# Patient Record
Sex: Female | Born: 1995 | Race: White | Hispanic: No | Marital: Single | Smoking: Current every day smoker
Health system: Southern US, Community
[De-identification: ages and names within clinical notes are randomized; demographics above are authoritative.]

## PROBLEM LIST (undated history)

## (undated) ENCOUNTER — Inpatient Hospital Stay (HOSPITAL_COMMUNITY): Payer: Self-pay

## (undated) DIAGNOSIS — N760 Acute vaginitis: Secondary | ICD-10-CM

## (undated) DIAGNOSIS — B029 Zoster without complications: Secondary | ICD-10-CM

## (undated) DIAGNOSIS — I10 Essential (primary) hypertension: Secondary | ICD-10-CM

## (undated) DIAGNOSIS — D649 Anemia, unspecified: Secondary | ICD-10-CM

## (undated) HISTORY — PX: WISDOM TOOTH EXTRACTION: SHX21

---
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
## 2013-05-07 ENCOUNTER — Inpatient Hospital Stay (HOSPITAL_COMMUNITY): Admission: AD | Admit: 2013-05-07 | Payer: Self-pay | Source: Ambulatory Visit | Admitting: Obstetrics and Gynecology

## 2013-09-15 LAB — OB RESULTS CONSOLE GC/CHLAMYDIA
Chlamydia: NEGATIVE
Gonorrhea: NEGATIVE

## 2013-09-27 ENCOUNTER — Other Ambulatory Visit: Payer: Self-pay

## 2013-09-27 LAB — OB RESULTS CONSOLE ABO/RH: RH Type: POSITIVE

## 2013-09-27 LAB — OB RESULTS CONSOLE RPR: RPR: NONREACTIVE

## 2013-09-27 LAB — OB RESULTS CONSOLE RUBELLA ANTIBODY, IGM: RUBELLA: IMMUNE

## 2013-09-27 LAB — OB RESULTS CONSOLE ANTIBODY SCREEN: ANTIBODY SCREEN: NEGATIVE

## 2013-09-27 LAB — OB RESULTS CONSOLE HIV ANTIBODY (ROUTINE TESTING): HIV: NONREACTIVE

## 2013-09-27 LAB — OB RESULTS CONSOLE HEPATITIS B SURFACE ANTIGEN: HEP B S AG: NEGATIVE

## 2013-10-07 NOTE — L&D Delivery Note (Signed)
Patient was C/C/+3 and pushed for 5 minutes with epidural.   NSVD  female infant, Apgars 9,9, weight P.   The patient had two small first degree bilateral labial lacerations repaired with 3-0 vicryl r. Fundus was firm. EBL was expected. Placenta was delivered intact. Vagina was clear.  Baby was vigorous and doing skin to skin with mother.  Anita Herrera A

## 2013-10-29 ENCOUNTER — Encounter (HOSPITAL_COMMUNITY): Payer: Self-pay

## 2013-10-29 ENCOUNTER — Ambulatory Visit (HOSPITAL_COMMUNITY)
Admission: RE | Admit: 2013-10-29 | Discharge: 2013-10-29 | Disposition: A | Discharge status: Home / Self Care | Payer: Medicaid Other | Source: Ambulatory Visit | Attending: Obstetrics and Gynecology | Admitting: Obstetrics and Gynecology

## 2013-10-29 NOTE — ED Notes (Signed)
°  Genetic Counseling  DOB: [birth year] Referring Provider: Levi AlandAnderson, Mark E, MD Appointment Date: 10/29/2013 Attending: Dr. Particia NearingMartha Decker  Anita Herrera was seen for genetic counseling because of an increased risk for fetal Down syndrome based on a First trimester screen.  She was counseled regarding the First trimester screen result and the associated 1 in 170 risk for fetal Down syndrome.  We reviewed chromosomes, nondisjunction, and the common features and variable prognosis of Down syndrome.  In addition, we reviewed the screen adjusted reduction in risks for trisomy 10618.  We also discussed other explanations for a screen positive result including: a gestational dating error, differences in maternal metabolism, and normal variation. She understands that this screening is not diagnostic for Down syndrome but provides a risk assessment.  We reviewed available screening options including noninvasive prenatal screening (NIPS)/cell free fetal DNA (cffDNA) testing, and detailed ultrasound.  She was counseled that screening tests are used to modify a patient's a priori risk for aneuploidy, typically based on age. This estimate provides a pregnancy specific risk assessment. We reviewed the benefits and limitations of each option. Specifically, we discussed the conditions for which each test screens, the detection rates, and false positive rates of each. She was also counseled regarding diagnostic testing via amniocentesis. We reviewed the approximate 1 in 300-500 risk for complications for amniocentesis, including spontaneous pregnancy loss.   After consideration of all the options, she declined further testing at this time.  She would like to wait for her anatomy ultrasound at her primary OB's office and see if features of Down syndrome are identified at that time. She understands that screening tests (including ultrasound) cannot rule out all birth defects or genetic syndromes. The patient was  advised of this limitation and states she still does not want additional testing or screening at this time.   Anita Herrera was provided with written information regarding sickle cell anemia (SCA) including the carrier frequency and incidence in the African-American population, the availability of carrier testing and prenatal diagnosis if indicated.  In addition, we discussed that hemoglobinopathies are routinely screened for as part of the [facility] newborn screening panel.  Hemoglobin electrophoresis has already been performed and was reported to be within normal limits.   Both family histories were reviewed and found to be noncontributory. Anita Herrera  was not familiar with the father of the baby's family history.  We, therefore, cannot comment on how his history might contribute to the overall chance for the baby to have a birth defect.  Without further information regarding the provided family history, an accurate genetic risk cannot be calculated. Further genetic counseling is warranted if more information is obtained.  Anita Herrera denied exposure to environmental toxins or chemical agents. She denied the use of alcohol, tobacco or street drugs. She denied significant viral illnesses during the course of her pregnancy. Her medical and surgical histories were noncontributory.   I counseled Anita Herrera for approximately 40 minutes regarding the above risks and available options.   Mady Gemmaaragh Conrad, MS  Certified Genetic Counselor

## 2013-12-09 ENCOUNTER — Other Ambulatory Visit: Payer: Self-pay

## 2013-12-09 ENCOUNTER — Other Ambulatory Visit (HOSPITAL_COMMUNITY): Payer: Self-pay | Admitting: Obstetrics and Gynecology

## 2013-12-09 DIAGNOSIS — O35BXX0 Maternal care for other (suspected) fetal abnormality and damage, fetal cardiac anomalies, not applicable or unspecified: Secondary | ICD-10-CM

## 2013-12-09 DIAGNOSIS — Z3689 Encounter for other specified antenatal screening: Secondary | ICD-10-CM

## 2013-12-09 DIAGNOSIS — O358XX0 Maternal care for other (suspected) fetal abnormality and damage, not applicable or unspecified: Secondary | ICD-10-CM

## 2013-12-13 ENCOUNTER — Ambulatory Visit (HOSPITAL_COMMUNITY)
Admission: RE | Admit: 2013-12-13 | Discharge: 2013-12-13 | Disposition: A | Discharge status: Home / Self Care | Payer: Medicaid Other | Source: Ambulatory Visit | Attending: Obstetrics and Gynecology | Admitting: Obstetrics and Gynecology

## 2013-12-13 ENCOUNTER — Encounter (HOSPITAL_COMMUNITY): Payer: Self-pay

## 2013-12-13 DIAGNOSIS — O35BXX0 Maternal care for other (suspected) fetal abnormality and damage, fetal cardiac anomalies, not applicable or unspecified: Secondary | ICD-10-CM

## 2013-12-13 DIAGNOSIS — Z363 Encounter for antenatal screening for malformations: Secondary | ICD-10-CM | POA: Insufficient documentation

## 2013-12-13 DIAGNOSIS — Z3689 Encounter for other specified antenatal screening: Secondary | ICD-10-CM

## 2013-12-13 DIAGNOSIS — Z1389 Encounter for screening for other disorder: Secondary | ICD-10-CM | POA: Insufficient documentation

## 2013-12-13 DIAGNOSIS — O289 Unspecified abnormal findings on antenatal screening of mother: Secondary | ICD-10-CM | POA: Insufficient documentation

## 2013-12-13 DIAGNOSIS — O358XX0 Maternal care for other (suspected) fetal abnormality and damage, not applicable or unspecified: Secondary | ICD-10-CM | POA: Insufficient documentation

## 2013-12-13 NOTE — Progress Notes (Signed)
[facility] ultrasound  Indication: 18 yr old G1P0 at 54w6dwith low PAPP-A of 0.33MoM and increased risk of trisomy 228of 1:170 for fetal anatomic survey. Finding of echogenic focus on outside ultrasound.  Findings: 1. Single intrauterine pregnancy. 2. Fetaal biometry is consistent with dating. 3. Posterior placenta without evidence of previa. 4. Normal amniotic fluid volume. 5. Normal transabdominal cervical length. 6. There is an echogenic focus in the left ventricle. 7. The remainder of the fetal anatomic survey is normal.  Recommendations: 1. Appropriate fetal growth. 2. Normal fetal anatomic survey. 3. Echogenic focus in the left ventricle: discussed the slight association with fetal aneuploidy; specifically trisomy 21. Discussed when found in isolation it increases the risk of trisomy 21 by approximately 10%. Patient had first trimester screen which showed risk of trisomy 21 of 1:170. Discussed ultrasound finding today increases this risk by about 10%. Patient had previously met with genetic counselor to discuss results and options including amniocentesis and cell free fetal DNA. Again offered cell free fetal DNA and amniocentesis and risks/benefits/limitations of each.  Discussed options of cell free fetal DNA and its limitations in detecting fetal aneuploidy. Discussed benefits/risks of amniocentesis including risk of bleeding, infection, rupture of membranes, or pregnancy loss of 1/400. After counseling patient declined all of the above 4. Low PAPP-A: I discussed the association with fetal aneuploidy. I also discussed the association with increased risk of adverse pregnancy outcomes including increased risk of fetal growth restriction, preeclampsia, stillbirth, preterm birth, and abruption. I discussed these risks are only slightly increased over baseline but heightened surveillance is warranted. I recommend serial fetal growth ultrasounds every 4-6 weeks. I recommend  antenatal testing only in the setting of fetal growth restriction. I recommend close surveillance for the development of signs/symptoms of preterm labor, PPROM, and preeclampsia.   [place] MD

## 2014-01-27 LAB — OB RESULTS CONSOLE RPR: RPR: NONREACTIVE

## 2014-02-21 LAB — OB RESULTS CONSOLE GC/CHLAMYDIA
Chlamydia: NEGATIVE
Gonorrhea: NEGATIVE

## 2014-03-21 LAB — OB RESULTS CONSOLE GBS: GBS: POSITIVE

## 2014-04-08 ENCOUNTER — Inpatient Hospital Stay (HOSPITAL_COMMUNITY)
Admission: AD | Admit: 2014-04-08 | Discharge: 2014-04-08 | Disposition: A | Discharge status: Home / Self Care | Payer: Medicaid Other | Source: Ambulatory Visit | Attending: Obstetrics and Gynecology | Admitting: Obstetrics and Gynecology

## 2014-04-08 DIAGNOSIS — O9989 Other specified diseases and conditions complicating pregnancy, childbirth and the puerperium: Principal | ICD-10-CM

## 2014-04-08 DIAGNOSIS — O212 Late vomiting of pregnancy: Secondary | ICD-10-CM | POA: Insufficient documentation

## 2014-04-08 DIAGNOSIS — K219 Gastro-esophageal reflux disease without esophagitis: Secondary | ICD-10-CM

## 2014-04-08 DIAGNOSIS — O99330 Smoking (tobacco) complicating pregnancy, unspecified trimester: Secondary | ICD-10-CM | POA: Insufficient documentation

## 2014-04-08 DIAGNOSIS — O99891 Other specified diseases and conditions complicating pregnancy: Secondary | ICD-10-CM | POA: Insufficient documentation

## 2014-04-08 LAB — URINALYSIS, ROUTINE W REFLEX MICROSCOPIC
Bilirubin Urine: NEGATIVE
GLUCOSE, UA: NEGATIVE mg/dL
Ketones, ur: NEGATIVE mg/dL
LEUKOCYTES UA: NEGATIVE
Nitrite: NEGATIVE
PH: 7 (ref 5.0–8.0)
Protein, ur: NEGATIVE mg/dL
SPECIFIC GRAVITY, URINE: 1.01 (ref 1.005–1.030)
Urobilinogen, UA: 0.2 mg/dL (ref 0.0–1.0)

## 2014-04-08 LAB — URINE MICROSCOPIC-ADD ON

## 2014-04-08 MED ORDER — PROMETHAZINE HCL 12.5 MG PO TABS: 12.5000 mg | ORAL_TABLET | Freq: Four times a day (QID) | ORAL | Status: DC | PRN

## 2014-04-08 NOTE — MAU Note (Signed)
Patient states she has been vomiting off and on for weeks. Sometimes the second time she vomits its dark red like blood. Denies contractions, vaginal bleeding, LOF.

## 2014-04-08 NOTE — Discharge Instructions (Signed)
Get Prilosec or Pepcid over-the-counter and take twice a day at least until next visit in the office  Heartburn During Pregnancy  Heartburn happens when stomach acid goes up into the esophagus. The esophagus is the tube between the mouth and the stomach. This acid causes a burning pain in the chest or throat. This happens more often in the later part of pregnancy because the womb (uterus) gets larger. It may also happen because of hormone changes. Heartburn problems often go away after giving birth. HOME CARE  Take all medicine as told by your doctor.  Raise the head of your bed with blocks only as told by your doctor.  Do not exercise right after eating.  Avoid eating 2-3 hours before bed. Do not lie down right after eating.  Eat small meals throughout the day instead of 3 large meals.  Avoid foods that give you heartburn. Foods you may want to avoid include:  Peppers.  Chocolate.  High-fat foods, including fried foods.  Spicy foods.  Garlic and onions.  Citrus fruits, including oranges, grapefruit, lemons, and limes.  Food containing tomatoes or tomato products.  Mint.  Bubbly (carbonated) drinks and drinks with caffeine.  Vinegar. GET HELP IF:  You have any belly (abdominal) pain.  You feel burning in your upper belly or chest, especially after eating or lying down.  You feel sick to your stomach (nauseous) and throw up (vomit).  Your stomach feels upset after you eat. GET HELP RIGHT AWAY IF:  You have bad chest pain that goes down your arm or into your jaw or neck.  You feel sweaty, dizzy, or light-headed.  You have trouble breathing.  You throw up blood.  You have trouble or pain when swallowing.  You have bloody or black poop (stool).  You have heartburn more than 3 times a week, for more than 2 weeks. MAKE SURE YOU:  Understand these instructions.  Will watch your condition.  Will get help right away if you are not doing well or get  worse. Document Released: 10/26/2010 Document Revised: 09/28/2013 Document Reviewed: 05/12/2013 [facility] Patient Information 2015 [place], [place]. This information is not intended to replace advice given to you by your health care provider. Make sure you discuss any questions you have with your health care provider.

## 2014-04-08 NOTE — MAU Provider Note (Signed)
History     CSN: [id number]  Arrival date and time: 04/08/14 0050   First Provider Initiated Contact with Patient 04/08/14 0116      Chief Complaint  Patient presents with  . Emesis During Pregnancy   HPI Anita Herrera is a 18 y.o. G1P0000 at 6237w3d who presents to MAU today with complaint of bloody emesis. The patient states that she has had vomiting after eating over the last few days. She states 2 episodes today and one yesterday. She denies sick contacts. She has noted occasional loose stools without diarrhea. She notes that she vomits up the food that she just ate and then will vomit mucus with blood in it. She denies heartburn or abdominal pain. She reports good fetal movement and denies contractions, vaginal bleeding or LOF.   OB History   Grav Para Term Preterm Abortions TAB SAB Ect Mult Living   1 0 0 0 0 0 0 0 0 0       No past medical history on file.  No past surgical history on file.  No family history on file.  History  Substance Use Topics  . Smoking status: Current Every Day Smoker  . Smokeless tobacco: Not on file  . Alcohol Use: Not on file    Allergies: No Known Allergies  Prescriptions prior to admission  Medication Sig Dispense Refill  . azithromycin (ZITHROMAX Z-PAK) 250 MG tablet Take 2 tablets daily x 1 day then one tablet daily x 4 days  6 each  0    Review of Systems  Constitutional: Negative for fever and malaise/fatigue.  Gastrointestinal: Positive for nausea and vomiting. Negative for abdominal pain, diarrhea and constipation.  Genitourinary:       Neg - vaginal bleeding, discharge, LOF   Physical Exam   Blood pressure 132/67, pulse 88, temperature 97.8 F (36.6 C), temperature source Oral, resp. rate 18.  Physical Exam  Constitutional: She is oriented to person, place, and time. She appears well-developed and well-nourished. No distress.  HENT:  Head: Normocephalic and atraumatic.  Cardiovascular: Normal rate, regular rhythm  and normal heart sounds.   Respiratory: Effort normal and breath sounds normal. No respiratory distress.  GI: Soft. Bowel sounds are normal. She exhibits no distension and no mass. There is no tenderness. There is no rebound and no guarding.  Neurological: She is alert and oriented to person, place, and time.  Skin: Skin is warm and dry. No erythema.  Psychiatric: She has a normal mood and affect.   Results for orders placed during the hospital encounter of 04/08/14 (from the past 24 hour(s))  URINALYSIS, ROUTINE W REFLEX MICROSCOPIC     Status: Abnormal   Collection Time    04/08/14 12:52 AM      Result Value Ref Range   Color, Urine YELLOW  YELLOW   APPearance CLEAR  CLEAR   Specific Gravity, Urine 1.010  1.005 - 1.030   pH 7.0  5.0 - 8.0   Glucose, UA NEGATIVE  NEGATIVE mg/dL   Hgb urine dipstick MODERATE (*) NEGATIVE   Bilirubin Urine NEGATIVE  NEGATIVE   Ketones, ur NEGATIVE  NEGATIVE mg/dL   Protein, ur NEGATIVE  NEGATIVE mg/dL   Urobilinogen, UA 0.2  0.0 - 1.0 mg/dL   Nitrite NEGATIVE  NEGATIVE   Leukocytes, UA NEGATIVE  NEGATIVE  URINE MICROSCOPIC-ADD ON     Status: Abnormal   Collection Time    04/08/14 12:52 AM      Result Value Ref Range  Squamous Epithelial / LPF FEW (*) RARE   WBC, UA 0-2  <3 WBC/hpf   RBC / HPF 11-20  <3 RBC/hpf   Bacteria, UA RARE  RARE    Fetal monitoring: Baseline: 120 bpm, moderate variability, + accelerations, no decelerations Contractions: Moderate UI  MAU Course  Procedures None  MDM Discussed with Dr. Henderson CloudHorvath. Rx for Phenergan and recommend Prilosec or Pepcid OTC BID. Follow-up as scheduled  Assessment and Plan  A: SIUP at 2775w3d Acid reflux  P: Discharge home Rx for Phenergan given to patient Patient advised to take Prilosec or Pepcid OTC BID  Patient advised to limit greasy, fried, acidic or spicy foods Patient advised to follow-up with Dr. Henderson CloudHorvath as scheduled for routine prenatal care Patient may return to MAU as  needed or if her condition were to change or worsen   Freddi StarrJulie N Ethier, PA-C  04/08/2014, 2:01 AM

## 2014-04-19 ENCOUNTER — Encounter (HOSPITAL_COMMUNITY): Payer: Self-pay | Admitting: *Deleted

## 2014-04-19 ENCOUNTER — Inpatient Hospital Stay (HOSPITAL_COMMUNITY): Payer: Medicaid Other | Admitting: Anesthesiology

## 2014-04-19 ENCOUNTER — Encounter (HOSPITAL_COMMUNITY): Payer: Medicaid Other | Admitting: Anesthesiology

## 2014-04-19 ENCOUNTER — Inpatient Hospital Stay (HOSPITAL_COMMUNITY)
Admission: AD | Admit: 2014-04-19 | Discharge: 2014-04-21 | DRG: 775 | Disposition: A | Discharge status: Home / Self Care | Payer: Medicaid Other | Source: Ambulatory Visit | Attending: Obstetrics and Gynecology | Admitting: Obstetrics and Gynecology

## 2014-04-19 DIAGNOSIS — O9989 Other specified diseases and conditions complicating pregnancy, childbirth and the puerperium: Secondary | ICD-10-CM

## 2014-04-19 DIAGNOSIS — E669 Obesity, unspecified: Secondary | ICD-10-CM | POA: Diagnosis present

## 2014-04-19 DIAGNOSIS — IMO0001 Reserved for inherently not codable concepts without codable children: Secondary | ICD-10-CM

## 2014-04-19 DIAGNOSIS — O99892 Other specified diseases and conditions complicating childbirth: Secondary | ICD-10-CM | POA: Diagnosis present

## 2014-04-19 DIAGNOSIS — Z87891 Personal history of nicotine dependence: Secondary | ICD-10-CM

## 2014-04-19 DIAGNOSIS — Z22330 Carrier of Group B streptococcus: Secondary | ICD-10-CM | POA: Diagnosis not present

## 2014-04-19 DIAGNOSIS — O99214 Obesity complicating childbirth: Secondary | ICD-10-CM

## 2014-04-19 DIAGNOSIS — O479 False labor, unspecified: Secondary | ICD-10-CM | POA: Diagnosis present

## 2014-04-19 HISTORY — DX: Zoster without complications: B02.9

## 2014-04-19 HISTORY — DX: Anemia, unspecified: D64.9

## 2014-04-19 HISTORY — DX: Acute vaginitis: N76.0

## 2014-04-19 LAB — CBC
HEMATOCRIT: 33.1 % — AB (ref 36.0–46.0)
HEMOGLOBIN: 11 g/dL — AB (ref 12.0–15.0)
MCH: 30.6 pg (ref 26.0–34.0)
MCHC: 33.2 g/dL (ref 30.0–36.0)
MCV: 91.9 fL (ref 78.0–100.0)
Platelets: 243 10*3/uL (ref 150–400)
RBC: 3.6 MIL/uL — AB (ref 3.87–5.11)
RDW: 13.2 % (ref 11.5–15.5)
WBC: 11.6 10*3/uL — ABNORMAL HIGH (ref 4.0–10.5)

## 2014-04-19 LAB — TYPE AND SCREEN
ABO/RH(D): O POS
Antibody Screen: NEGATIVE

## 2014-04-19 LAB — SAMPLE TO BLOOD BANK

## 2014-04-19 LAB — ABO/RH: ABO/RH(D): O POS

## 2014-04-19 LAB — RPR

## 2014-04-19 MED ORDER — FERROUS SULFATE 325 (65 FE) MG PO TABS
325.0000 mg | ORAL_TABLET | Freq: Two times a day (BID) | ORAL | Status: DC
  Administered 2014-04-20 – 2014-04-21 (×3): 325 mg via ORAL
  Filled 2014-04-19 (×3): qty 1

## 2014-04-19 MED ORDER — SIMETHICONE 80 MG PO CHEW
80.0000 mg | CHEWABLE_TABLET | ORAL | Status: DC | PRN
  Administered 2014-04-20: 80 mg via ORAL
  Filled 2014-04-19: qty 1

## 2014-04-19 MED ORDER — CITRIC ACID-SODIUM CITRATE 334-500 MG/5ML PO SOLN: 30.0000 mL | ORAL | Status: DC | PRN

## 2014-04-19 MED ORDER — METHYLERGONOVINE MALEATE 0.2 MG/ML IJ SOLN
INTRAMUSCULAR | Status: AC
  Filled 2014-04-19: qty 1

## 2014-04-19 MED ORDER — SODIUM CHLORIDE 0.9 % IV SOLN: 250.0000 mL | INTRAVENOUS | Status: DC | PRN

## 2014-04-19 MED ORDER — ACETAMINOPHEN 325 MG PO TABS: 650.0000 mg | ORAL_TABLET | ORAL | Status: DC | PRN

## 2014-04-19 MED ORDER — METHYLERGONOVINE MALEATE 0.2 MG/ML IJ SOLN: 0.2000 mg | INTRAMUSCULAR | Status: DC | PRN

## 2014-04-19 MED ORDER — OXYTOCIN 40 UNITS IN LACTATED RINGERS INFUSION - SIMPLE MED
62.5000 mL/h | INTRAVENOUS | Status: DC
  Filled 2014-04-19: qty 1000

## 2014-04-19 MED ORDER — LACTATED RINGERS IV SOLN
500.0000 mL | Freq: Once | INTRAVENOUS | Status: AC
  Administered 2014-04-19: 500 mL via INTRAVENOUS

## 2014-04-19 MED ORDER — PHENYLEPHRINE 40 MCG/ML (10ML) SYRINGE FOR IV PUSH (FOR BLOOD PRESSURE SUPPORT)
80.0000 ug | PREFILLED_SYRINGE | INTRAVENOUS | Status: DC | PRN
  Filled 2014-04-19: qty 10
  Filled 2014-04-19: qty 2

## 2014-04-19 MED ORDER — EPHEDRINE 5 MG/ML INJ
10.0000 mg | INTRAVENOUS | Status: DC | PRN
  Filled 2014-04-19: qty 2

## 2014-04-19 MED ORDER — SENNOSIDES-DOCUSATE SODIUM 8.6-50 MG PO TABS
2.0000 | ORAL_TABLET | ORAL | Status: DC
  Administered 2014-04-19 – 2014-04-20 (×2): 2 via ORAL
  Filled 2014-04-19 (×2): qty 2

## 2014-04-19 MED ORDER — OXYCODONE-ACETAMINOPHEN 5-325 MG PO TABS: 1.0000 | ORAL_TABLET | ORAL | Status: DC | PRN

## 2014-04-19 MED ORDER — PENICILLIN G POTASSIUM 5000000 UNITS IJ SOLR
5.0000 10*6.[IU] | Freq: Once | INTRAMUSCULAR | Status: AC
  Administered 2014-04-19: 5 10*6.[IU] via INTRAVENOUS
  Filled 2014-04-19: qty 5

## 2014-04-19 MED ORDER — MEASLES, MUMPS & RUBELLA VAC ~~LOC~~ INJ: 0.5000 mL | INJECTION | Freq: Once | SUBCUTANEOUS | Status: DC

## 2014-04-19 MED ORDER — DIBUCAINE 1 % RE OINT: 1.0000 "application " | TOPICAL_OINTMENT | RECTAL | Status: DC | PRN

## 2014-04-19 MED ORDER — IBUPROFEN 600 MG PO TABS: 600.0000 mg | ORAL_TABLET | Freq: Four times a day (QID) | ORAL | Status: DC | PRN

## 2014-04-19 MED ORDER — DIPHENHYDRAMINE HCL 50 MG/ML IJ SOLN: 12.5000 mg | INTRAMUSCULAR | Status: DC | PRN

## 2014-04-19 MED ORDER — PRENATAL MULTIVITAMIN CH
1.0000 | ORAL_TABLET | Freq: Every day | ORAL | Status: DC
  Filled 2014-04-19: qty 1

## 2014-04-19 MED ORDER — LACTATED RINGERS IV SOLN
INTRAVENOUS | Status: DC
  Administered 2014-04-19: 11:00:00 via INTRAVENOUS

## 2014-04-19 MED ORDER — IBUPROFEN 800 MG PO TABS
800.0000 mg | ORAL_TABLET | Freq: Three times a day (TID) | ORAL | Status: DC
  Administered 2014-04-19 – 2014-04-21 (×5): 800 mg via ORAL
  Filled 2014-04-19 (×5): qty 1

## 2014-04-19 MED ORDER — FENTANYL 2.5 MCG/ML BUPIVACAINE 1/10 % EPIDURAL INFUSION (WH - ANES)
14.0000 mL/h | INTRAMUSCULAR | Status: DC | PRN
  Administered 2014-04-19: 14 mL/h via EPIDURAL
  Filled 2014-04-19: qty 125

## 2014-04-19 MED ORDER — BENZOCAINE-MENTHOL 20-0.5 % EX AERO: 1.0000 "application " | INHALATION_SPRAY | CUTANEOUS | Status: DC | PRN

## 2014-04-19 MED ORDER — ZOLPIDEM TARTRATE 5 MG PO TABS: 5.0000 mg | ORAL_TABLET | Freq: Every evening | ORAL | Status: DC | PRN

## 2014-04-19 MED ORDER — LANOLIN HYDROUS EX OINT: TOPICAL_OINTMENT | CUTANEOUS | Status: DC | PRN

## 2014-04-19 MED ORDER — ONDANSETRON HCL 4 MG/2ML IJ SOLN
4.0000 mg | Freq: Four times a day (QID) | INTRAMUSCULAR | Status: DC | PRN
  Administered 2014-04-19: 4 mg via INTRAVENOUS
  Filled 2014-04-19: qty 2

## 2014-04-19 MED ORDER — SODIUM CHLORIDE 0.9 % IJ SOLN: 3.0000 mL | Freq: Two times a day (BID) | INTRAMUSCULAR | Status: DC

## 2014-04-19 MED ORDER — TETANUS-DIPHTH-ACELL PERTUSSIS 5-2.5-18.5 LF-MCG/0.5 IM SUSP
0.5000 mL | Freq: Once | INTRAMUSCULAR | Status: AC
  Administered 2014-04-20: 0.5 mL via INTRAMUSCULAR
  Filled 2014-04-19: qty 0.5

## 2014-04-19 MED ORDER — ONDANSETRON HCL 4 MG/2ML IJ SOLN: 4.0000 mg | INTRAMUSCULAR | Status: DC | PRN

## 2014-04-19 MED ORDER — DIPHENHYDRAMINE HCL 25 MG PO CAPS: 25.0000 mg | ORAL_CAPSULE | Freq: Four times a day (QID) | ORAL | Status: DC | PRN

## 2014-04-19 MED ORDER — ONDANSETRON HCL 4 MG PO TABS: 4.0000 mg | ORAL_TABLET | ORAL | Status: DC | PRN

## 2014-04-19 MED ORDER — SODIUM CHLORIDE 0.9 % IJ SOLN: 3.0000 mL | INTRAMUSCULAR | Status: DC | PRN

## 2014-04-19 MED ORDER — LACTATED RINGERS IV SOLN: 500.0000 mL | INTRAVENOUS | Status: DC | PRN

## 2014-04-19 MED ORDER — OXYTOCIN BOLUS FROM INFUSION
500.0000 mL | INTRAVENOUS | Status: DC
  Administered 2014-04-19: 500 mL via INTRAVENOUS

## 2014-04-19 MED ORDER — PHENYLEPHRINE 40 MCG/ML (10ML) SYRINGE FOR IV PUSH (FOR BLOOD PRESSURE SUPPORT)
80.0000 ug | PREFILLED_SYRINGE | INTRAVENOUS | Status: DC | PRN
  Filled 2014-04-19: qty 2

## 2014-04-19 MED ORDER — METHYLERGONOVINE MALEATE 0.2 MG PO TABS: 0.2000 mg | ORAL_TABLET | ORAL | Status: DC | PRN

## 2014-04-19 MED ORDER — LIDOCAINE HCL (PF) 1 % IJ SOLN
INTRAMUSCULAR | Status: DC | PRN
  Administered 2014-04-19: 10 mL

## 2014-04-19 MED ORDER — METHYLERGONOVINE MALEATE 0.2 MG/ML IJ SOLN
0.2000 mg | Freq: Once | INTRAMUSCULAR | Status: AC
  Administered 2014-04-19: 0.2 mg via INTRAMUSCULAR

## 2014-04-19 MED ORDER — FENTANYL 2.5 MCG/ML BUPIVACAINE 1/10 % EPIDURAL INFUSION (WH - ANES): 14.0000 mL/h | INTRAMUSCULAR | Status: DC | PRN

## 2014-04-19 MED ORDER — WITCH HAZEL-GLYCERIN EX PADS: 1.0000 "application " | MEDICATED_PAD | CUTANEOUS | Status: DC | PRN

## 2014-04-19 MED ORDER — LIDOCAINE HCL (PF) 1 % IJ SOLN
30.0000 mL | INTRAMUSCULAR | Status: DC | PRN
  Filled 2014-04-19: qty 30

## 2014-04-19 MED ORDER — MAGNESIUM HYDROXIDE 400 MG/5ML PO SUSP: 30.0000 mL | ORAL | Status: DC | PRN

## 2014-04-19 MED ORDER — PENICILLIN G POTASSIUM 5000000 UNITS IJ SOLR
2.5000 10*6.[IU] | INTRAMUSCULAR | Status: DC
  Administered 2014-04-19: 2.5 10*6.[IU] via INTRAVENOUS
  Filled 2014-04-19 (×4): qty 2.5

## 2014-04-19 MED ORDER — BUTORPHANOL TARTRATE 1 MG/ML IJ SOLN
1.0000 mg | INTRAMUSCULAR | Status: DC | PRN
Start: 2014-04-19 — End: 2014-04-19

## 2014-04-19 NOTE — Anesthesia Procedure Notes (Signed)
Epidural Patient location during procedure: OB  Preanesthetic Checklist Completed: patient identified, site marked, surgical consent, pre-op evaluation, timeout performed, IV checked, risks and benefits discussed and monitors and equipment checked  Epidural Patient position: sitting Prep: site prepped and draped and DuraPrep Patient monitoring: continuous pulse ox and blood pressure Approach: midline Injection technique: LOR air  Needle:  Needle type: Tuohy  Needle gauge: 17 G Needle length: 9 cm and 9 Needle insertion depth: 7 cm Catheter type: closed end flexible Catheter size: 19 Gauge Catheter at skin depth: 14 cm Test dose: negative  Assessment Events: blood not aspirated, injection not painful, no injection resistance, negative IV test and no paresthesia  Additional Notes Dosing of Epidural:  1st dose, through catheter .............................................  Xylocaine 40 mg  2nd dose, through catheter, after waiting 3 minutes.........Xylocaine 60 mg    ( 1% Xylo charted as a single dose in Epic Meds for ease of charting; actual dosing was fractionated as above, for saftey's sake)  As each dose occurred, patient was free of IV sx; and patient exhibited no evidence of SA injection.  Patient is more comfortable after epidural dosed. Please see RN's note for documentation of vital signs,and FHR which are stable.  Patient reminded not to try to ambulate with numb legs, and that an RN must be present when she attempts to get up.       

## 2014-04-19 NOTE — Anesthesia Preprocedure Evaluation (Signed)

## 2014-04-19 NOTE — MAU Note (Signed)
Pt states she began having uc's during the night, became more regular & intense @ 0600.  Denies bleeding or LOF, does have mucus discharge.  Was 2 cm's in MD office last Friday.

## 2014-04-19 NOTE — H&P (Signed)
18 y.o. 3860w0d  G1P0000 comes in c/o labor.  Otherwise has good fetal movement and no bleeding.  History reviewed. No pertinent past medical history. History reviewed. No pertinent past surgical history.  OB History  Gravida Para Term Preterm AB SAB TAB Ectopic Multiple Living  1 0 0 0 0 0 0 0 0 0     # Outcome Date GA Lbr Len/2nd Weight Sex Delivery Anes PTL Lv  1 CUR               History   Social History  . Marital Status: Single    Spouse Name: N/A    Number of Children: N/A  . Years of Education: N/A   Occupational History  . Not on file.   Social History Main Topics  . Smoking status: Former Smoker    Quit date: 08/20/2013  . Smokeless tobacco: Not on file  . Alcohol Use: Yes     Comment: Occas. prior to preg.  . Drug Use: No  . Sexual Activity: Not on file   Other Topics Concern  . Not on file   Social History Narrative   Marital status: single      Lives: with mother, brother.        Education: [employer] to graduate Spring 2014.  Hopes to go to college after high school         Review of patient's allergies indicates no known allergies.    Prenatal Transfer Tool  Maternal Diabetes: No Genetic Screening: Abnormal:  Results: Elevated risk of Trisomy 21- Also had isolated EICF and pt seen by MFM; declined all testing Maternal Ultrasounds/Referrals: Abnormal:  Findings:   Isolated EIF (echogenic intracardiac focus) Fetal Ultrasounds or other Referrals:  Referred to Materal Fetal Medicine  Maternal Substance Abuse:  No Significant Maternal Medications:  None Significant Maternal Lab Results: None  Other PNC: uncomplicated.    Filed Vitals:   04/19/14 1000  BP: 156/84  Pulse: 88     Lungs/Cor:  NAD Abdomen:  soft, gravid Ex:  no cords, erythema SVE:  6-7/90/-2 FHTs:  120s, good STV, NST R Toco:  q3-4   A/P   Term labor.   GBS POS- PCN.  Mcclellan Demarais A

## 2014-04-20 LAB — CBC
HEMATOCRIT: 30.9 % — AB (ref 36.0–46.0)
Hemoglobin: 10.2 g/dL — ABNORMAL LOW (ref 12.0–15.0)
MCH: 30.4 pg (ref 26.0–34.0)
MCHC: 33 g/dL (ref 30.0–36.0)
MCV: 92.2 fL (ref 78.0–100.0)
Platelets: 219 10*3/uL (ref 150–400)
RBC: 3.35 MIL/uL — ABNORMAL LOW (ref 3.87–5.11)
RDW: 13.2 % (ref 11.5–15.5)
WBC: 14.4 10*3/uL — ABNORMAL HIGH (ref 4.0–10.5)

## 2014-04-20 NOTE — Anesthesia Postprocedure Evaluation (Signed)
  Anesthesia Post-op Note  Anesthesia Post Note  Patient: Anita Herrera  Procedure(s) Performed: * No procedures listed *  Anesthesia type: Epidural  Patient location: Mother/Baby  Post pain: Pain level controlled  Post assessment: Post-op Vital signs reviewed  Last Vitals:  Filed Vitals:   04/20/14 0553  BP: 120/79  Pulse: 76  Temp: 36.8 C  Resp: 18    Post vital signs: Reviewed  Level of consciousness:alert  Complications: No apparent anesthesia complications

## 2014-04-20 NOTE — Progress Notes (Signed)
Post Partum Day 1 Subjective: no complaints, up ad lib, voiding and + flatus  Objective: Blood pressure 120/71, pulse 85, temperature 97.8 F (36.6 C), temperature source Oral, resp. rate 18, height 5\' 6"  (1.676 m), weight 110.678 kg (244 lb), SpO2 98.00%, unknown if currently breastfeeding.  Physical Exam:  General: alert, cooperative and no distress Lochia: appropriate Uterine Fundus: firm DVT Evaluation: No evidence of DVT seen on physical exam. Negative Homan's sign. No cords or calf tenderness.   Recent Labs  04/19/14 1055 04/20/14 0605  HGB 11.0* 10.2*  HCT 33.1* 30.9*    Assessment/Plan: Plan for discharge tomorrow, Breastfeeding and Lactation consult   LOS: 1 day   Mihran Lebarron STACIA 04/20/2014, 11:07 AM

## 2014-04-20 NOTE — Progress Notes (Signed)
UR chart review completed.  

## 2014-04-20 NOTE — Lactation Note (Signed)
This note was copied from the chart of Anita Khloee Garza. Lactation Consultation Note  Patient Name: Anita Herrera ZOXWR'[id number] Date: 04/20/2014 Reason for consult: Follow-up assessment;Difficult latch  LC asked to see pt due to infant has only had BFx2 (5-7 min) + 4 attempts + formula x1 (5ml); voids-3; stools-3 and is 23 hrs old.  Infant very spitty and gaggy, sleepy and not interested since birth.  Infant was about to approach 24 hrs old at time of visit.  Worked with infant using waking techniques and worked with mom hand expressing and using hand pump.  Left infant skin-to-skin with mom for about 30 minutes and then returned.  Mom had hand expressed a few drops and was hand pumping larger but few drops.  Infant began showing cues several attempts to get infant to latch.  When assessing suck with gloved finger, infant's suck is very disorganized, no cupping of finger with tongue, humping back of tongue, and is biting.  Attempted to get into a suck pattern.  Infant finally latched with depth and great flanged lips.  Infant stayed on breast with slow but consistent sucking.  Mom c/o pain after 10 minutes of feeding.  Nipple was very pinched and flat.  LC did an oral assessment of mouth and noted a tight, thin frenulum; witnessed by RN in room.  LC informed mom of reason why there is pain with latching and told mom to discuss with her Peds MD.  Encouraged mom to latch with feedings to give infant practice with latching for as long as she can tolerate.  LC spoke to Teaching Service Peds MD about assessment findings.  Peds MD assessed infant's mouth and spoke with patient.  MD agreed with [facility] findings but said mom is hesitant at this time for a clipping.  MD said they will re-address with mom in the morning.    Feeding Plan: 1.  Latch infant for as long as mom can tolerate with wide mouth and flanged lips.  If infant still fussy after latching and needs more to eat then supplement with EBM &/or formula  (below) 2.  Post-pump with DEBP using hands-on pumping as taught by [facility] and hand expression at end of pumping session (RN to set up with DEBP) 3.  Feed any EBM obtained to infant and then follow with formula amount based on Day of Life guideline  Communicated feeding plan to RN and RN to set up with DEBP and initiate formula supplemental feeding based on guidelines.     Maternal Data Formula Feeding for Exclusion: Yes Reason for exclusion: Mother's choice to formula and breast feed on admission Has patient been taught Hand Expression?: Yes Does the patient have breastfeeding experience prior to this delivery?: No  Feeding Feeding Type: Breast Fed Length of feed: 10 min  LATCH Score/Interventions Latch: Repeated attempts needed to sustain latch, nipple held in mouth throughout feeding, stimulation needed to elicit sucking reflex. Intervention(s): Teach feeding cues;Waking techniques;Skin to skin  Audible Swallowing: A few with stimulation Intervention(s): Hand expression  Type of Nipple: Everted at rest and after stimulation  Comfort (Breast/Nipple): Filling, red/small blisters or bruises, mild/mod discomfort  Problem noted: Mild/Moderate discomfort Interventions (Mild/moderate discomfort): Comfort gels;Hand expression  Hold (Positioning): Assistance needed to correctly position infant at breast and maintain latch. Intervention(s): Skin to skin;Position options;Support Pillows;Breastfeeding basics reviewed  LATCH Score: 6  Lactation Tools Discussed/Used Tools: Pump Breast pump type: Manual Initiated by:: DEBP set up by RN  Date initiated:: 04/20/14   Consult  Status Consult Status: Follow-up Date: 04/21/14 Follow-up type: In-patient    Lendon KaVann, Macaulay Reicher Walker 04/20/2014, 4:32 PM

## 2014-04-20 NOTE — Lactation Note (Signed)
This note was copied from the chart of Anita Herrera. Lactation Consultation Note Mom holding baby STS, baby not interested in BF at this time. RN stated had been gaggy, not interested in sucking. As[facility] feeding to assist. Reviewed Baby & Me book's Breastfeeding Basics. Mom encouraged to feed baby w/feeding cuesWH/LC brochure given w/resources, support groups and LC services. Educated about newborn behavior. Patient Name: Anita Clayborne DanaBianca Ewart AVWUJ'WToday's Date: 04/20/2014     Maternal Data    Feeding Feeding Type: Breast Fed Length of feed: 0 min (few sucks (3))  LATCH Score/Interventions Latch: Too sleepy or reluctant, no latch achieved, no sucking elicited. Intervention(s): Skin to skin;Teach feeding cues;Waking techniques  Audible Swallowing: None Intervention(s): Skin to skin  Type of Nipple: Everted at rest and after stimulation  Comfort (Breast/Nipple): Soft / non-tender     Hold (Positioning): Assistance needed to correctly position infant at breast and maintain latch. Intervention(s): Breastfeeding basics reviewed;Skin to skin  LATCH Score: 5  Lactation Tools Discussed/Used     Consult Status      Anhthu Perdew G 04/20/2014, 2:03 AM

## 2014-04-21 MED ORDER — IBUPROFEN 600 MG PO TABS: 600.0000 mg | ORAL_TABLET | Freq: Four times a day (QID) | ORAL | Status: DC | PRN

## 2014-04-21 MED ORDER — OXYCODONE-ACETAMINOPHEN 5-325 MG PO TABS: 2.0000 | ORAL_TABLET | ORAL | Status: DC | PRN

## 2014-04-21 MED ORDER — DOCUSATE SODIUM 100 MG PO CAPS: 100.0000 mg | ORAL_CAPSULE | Freq: Two times a day (BID) | ORAL | Status: DC

## 2014-04-21 NOTE — Lactation Note (Signed)
This note was copied from the chart of Girl Clayborne DanaBianca Dusenbury. Lactation Consultation Note  Patient Name: Girl Clayborne DanaBianca Makar ZOXWR'UToday's Date: 04/21/2014  Follow [facility] with patient [facility]. Mother states she has not put baby to breast nor pumped [facility]. Mother states breastfeeding made her "sore" (nipples) so she plans to only feed her baby with a bottle. She has fed all formula and baby has increased volume intake to 28-35 ml per feeding and baby tolerating well. Mother states she plans to pump her milk and give by bottle but does not have a breast pump or plans for getting a pump other than [facility]. Discussed that milk will increase in volume due to postpartum course, benefits of breast milk and encouraged to pump and give by bottle. Mother does not want to put baby to breast. Mother has a hand pump and reviewed use and milk storage. Discussed the need to pump 8 times per day to stimulate and maintain milk supply. Mother has questions related to infants frenulum. Questions answered and referred mother to discuss further with her pediatrician. Mother supported in her decision to pump and provide breast milk. Mom made aware of O/P services, breastfeeding support groups, community resources, and our phone # for post-discharge questions. Also discussed if mother decided not to breast feed at all, ways to manage non-nursing engorgement.   Maternal Data    Feeding Feeding Type: Bottle Fed - Formula  LATCH Score/Interventions                      Lactation Tools Discussed/Used     Consult Status      Christella HartiganDaly, Olander Friedl M 04/21/2014, 9:45 AM

## 2014-04-21 NOTE — Discharge Summary (Signed)
Obstetric Discharge Summary Reason for Admission: onset of labor Prenatal Procedures: NST and ultrasound Intrapartum Procedures: spontaneous vaginal delivery Postpartum Procedures: none Complications-Operative and Postpartum: 1st degree perineal laceration Hemoglobin  Date Value Ref Range Status  04/20/2014 10.2* 12.0 - 15.0 g/dL Final     HCT  Date Value Ref Range Status  04/20/2014 30.9* 36.0 - 46.0 % Final    Physical Exam:  General: alert, cooperative and appears stated age 8Lochia: appropriate Uterine Fundus: firm  Discharge Diagnoses: Term Pregnancy-delivered  Discharge Information: Date: 04/21/2014 Activity: pelvic rest Diet: routine Medications: Ibuprofen, Colace and Percocet Condition: improved Instructions: refer to practice specific booklet Discharge to: home Follow-up Information   Follow up with HORVATH,MICHELLE A, MD In 4 weeks. (For a postpartum evaluation)    Specialty:  Obstetrics and Gynecology   Contact information:   [address] [place] [address] [phone]       Newborn Data: Live born female  Birth Weight: 8 lb 11 oz (3941 g) APGAR: 9, 9  Home with mother.  Anita Susan H. 04/21/2014, 12:27 PM

## 2014-08-08 ENCOUNTER — Encounter (HOSPITAL_COMMUNITY): Payer: Self-pay | Admitting: *Deleted

## 2014-10-07 NOTE — L&D Delivery Note (Signed)
Delivery Note Patient pushed well with one contraction.  At 6:48 PM a viable female was delivered via Vaginal, Spontaneous Delivery (Presentation: Middle Occiput Anterior).  APGAR: 7, 8; weight  pending.   Placenta status: Intact, Spontaneous.  Cord:  with the following complications: .  Cord pH: n/a  After delivery of the fetal head, the anterior (left) shoulder did not easily delivery with the usual gentle traction.  A tight nuchal/body cord was seen but not able to be reduced. The patient was instructed to stop pushing, and suprapubic pressure and McRoberts maneuver employed.  The anterior shoulder easily delivered followed by the body.  The baby was placed skin to skin with mom for initial assessment and noted to be moving both upper extremities.  Total time of shoulder dystocia < 30sec  Anesthesia: Epidural  Episiotomy: None Lacerations:  None Est. Blood Loss (mL):  100 mL  Mom to postpartum.  Baby to Couplet care / Skin to Skin.  Anita Herrera Hind Chesler 10/07/2015, 7:02 PM

## 2015-04-05 LAB — OB RESULTS CONSOLE HIV ANTIBODY (ROUTINE TESTING): HIV: NONREACTIVE

## 2015-04-05 LAB — OB RESULTS CONSOLE ABO/RH: RH Type: POSITIVE

## 2015-04-05 LAB — OB RESULTS CONSOLE GC/CHLAMYDIA
Chlamydia: NEGATIVE
Gonorrhea: NEGATIVE

## 2015-04-05 LAB — OB RESULTS CONSOLE RUBELLA ANTIBODY, IGM: Rubella: IMMUNE

## 2015-04-05 LAB — OB RESULTS CONSOLE RPR: RPR: NONREACTIVE

## 2015-04-05 LAB — OB RESULTS CONSOLE HEPATITIS B SURFACE ANTIGEN: Hepatitis B Surface Ag: NEGATIVE

## 2015-04-05 LAB — OB RESULTS CONSOLE ANTIBODY SCREEN: Antibody Screen: NEGATIVE

## 2015-09-08 LAB — OB RESULTS CONSOLE GBS: STREP GROUP B AG: POSITIVE

## 2015-09-29 ENCOUNTER — Other Ambulatory Visit: Payer: Self-pay | Admitting: Obstetrics and Gynecology

## 2015-10-06 ENCOUNTER — Encounter (HOSPITAL_COMMUNITY): Payer: Self-pay | Admitting: *Deleted

## 2015-10-06 ENCOUNTER — Telehealth (HOSPITAL_COMMUNITY): Payer: Self-pay | Admitting: *Deleted

## 2015-10-06 NOTE — Telephone Encounter (Signed)
Preadmission screen  

## 2015-10-07 ENCOUNTER — Inpatient Hospital Stay (HOSPITAL_COMMUNITY)
Admission: AD | Admit: 2015-10-07 | Discharge: 2015-10-09 | DRG: 775 | Disposition: A | Discharge status: Home / Self Care | Payer: Medicaid Other | Source: Ambulatory Visit | Attending: Obstetrics | Admitting: Obstetrics

## 2015-10-07 ENCOUNTER — Inpatient Hospital Stay (HOSPITAL_COMMUNITY): Payer: Medicaid Other | Admitting: Anesthesiology

## 2015-10-07 ENCOUNTER — Encounter (HOSPITAL_COMMUNITY): Payer: Self-pay | Admitting: *Deleted

## 2015-10-07 DIAGNOSIS — E669 Obesity, unspecified: Secondary | ICD-10-CM | POA: Diagnosis present

## 2015-10-07 DIAGNOSIS — Z3A39 39 weeks gestation of pregnancy: Secondary | ICD-10-CM | POA: Diagnosis not present

## 2015-10-07 DIAGNOSIS — Z87891 Personal history of nicotine dependence: Secondary | ICD-10-CM | POA: Diagnosis not present

## 2015-10-07 DIAGNOSIS — O99214 Obesity complicating childbirth: Secondary | ICD-10-CM | POA: Diagnosis present

## 2015-10-07 DIAGNOSIS — D509 Iron deficiency anemia, unspecified: Secondary | ICD-10-CM | POA: Diagnosis present

## 2015-10-07 DIAGNOSIS — O9902 Anemia complicating childbirth: Secondary | ICD-10-CM | POA: Diagnosis present

## 2015-10-07 DIAGNOSIS — Z6837 Body mass index (BMI) 37.0-37.9, adult: Secondary | ICD-10-CM | POA: Diagnosis not present

## 2015-10-07 DIAGNOSIS — O99824 Streptococcus B carrier state complicating childbirth: Secondary | ICD-10-CM | POA: Diagnosis present

## 2015-10-07 DIAGNOSIS — IMO0001 Reserved for inherently not codable concepts without codable children: Secondary | ICD-10-CM

## 2015-10-07 LAB — CBC
HEMATOCRIT: 30.3 % — AB (ref 36.0–46.0)
HEMOGLOBIN: 9.7 g/dL — AB (ref 12.0–15.0)
MCH: 27.6 pg (ref 26.0–34.0)
MCHC: 32 g/dL (ref 30.0–36.0)
MCV: 86.1 fL (ref 78.0–100.0)
Platelets: 212 10*3/uL (ref 150–400)
RBC: 3.52 MIL/uL — AB (ref 3.87–5.11)
RDW: 14.3 % (ref 11.5–15.5)
WBC: 7.9 10*3/uL (ref 4.0–10.5)

## 2015-10-07 LAB — TYPE AND SCREEN
ABO/RH(D): O POS
ANTIBODY SCREEN: NEGATIVE

## 2015-10-07 MED ORDER — ACETAMINOPHEN 325 MG PO TABS
650.0000 mg | ORAL_TABLET | ORAL | Status: DC | PRN
  Administered 2015-10-08: 650 mg via ORAL
  Filled 2015-10-07: qty 2

## 2015-10-07 MED ORDER — WITCH HAZEL-GLYCERIN EX PADS: 1.0000 "application " | MEDICATED_PAD | CUTANEOUS | Status: DC | PRN

## 2015-10-07 MED ORDER — OXYTOCIN 40 UNITS IN LACTATED RINGERS INFUSION - SIMPLE MED: 1.0000 m[IU]/min | INTRAVENOUS | Status: DC

## 2015-10-07 MED ORDER — LACTATED RINGERS IV SOLN
500.0000 mL | INTRAVENOUS | Status: DC | PRN
  Administered 2015-10-07: 500 mL via INTRAVENOUS

## 2015-10-07 MED ORDER — TERBUTALINE SULFATE 1 MG/ML IJ SOLN
0.2500 mg | Freq: Once | INTRAMUSCULAR | Status: DC | PRN
  Filled 2015-10-07: qty 1

## 2015-10-07 MED ORDER — ONDANSETRON HCL 4 MG/2ML IJ SOLN: 4.0000 mg | INTRAMUSCULAR | Status: DC | PRN

## 2015-10-07 MED ORDER — OXYTOCIN BOLUS FROM INFUSION: 500.0000 mL | INTRAVENOUS | Status: DC

## 2015-10-07 MED ORDER — OXYTOCIN 40 UNITS IN LACTATED RINGERS INFUSION - SIMPLE MED
1.0000 m[IU]/min | INTRAVENOUS | Status: DC
  Administered 2015-10-07: 1 m[IU]/min via INTRAVENOUS
  Filled 2015-10-07: qty 1000

## 2015-10-07 MED ORDER — SENNOSIDES-DOCUSATE SODIUM 8.6-50 MG PO TABS
2.0000 | ORAL_TABLET | ORAL | Status: DC
  Administered 2015-10-07 – 2015-10-08 (×2): 2 via ORAL
  Filled 2015-10-07 (×2): qty 2

## 2015-10-07 MED ORDER — PRENATAL MULTIVITAMIN CH
1.0000 | ORAL_TABLET | Freq: Every day | ORAL | Status: DC
  Administered 2015-10-08: 1 via ORAL
  Filled 2015-10-07: qty 1

## 2015-10-07 MED ORDER — DIPHENHYDRAMINE HCL 50 MG/ML IJ SOLN: 12.5000 mg | INTRAMUSCULAR | Status: DC | PRN

## 2015-10-07 MED ORDER — DIPHENHYDRAMINE HCL 25 MG PO CAPS: 25.0000 mg | ORAL_CAPSULE | Freq: Four times a day (QID) | ORAL | Status: DC | PRN

## 2015-10-07 MED ORDER — ONDANSETRON HCL 4 MG PO TABS: 4.0000 mg | ORAL_TABLET | ORAL | Status: DC | PRN

## 2015-10-07 MED ORDER — LIDOCAINE HCL (PF) 1 % IJ SOLN
30.0000 mL | INTRAMUSCULAR | Status: DC | PRN
  Filled 2015-10-07: qty 30

## 2015-10-07 MED ORDER — LACTATED RINGERS IV SOLN
INTRAVENOUS | Status: DC
  Administered 2015-10-07 (×2): via INTRAVENOUS

## 2015-10-07 MED ORDER — OXYTOCIN 40 UNITS IN LACTATED RINGERS INFUSION - SIMPLE MED
1.0000 m[IU]/min | INTRAVENOUS | Status: DC
  Administered 2015-10-07: 2 m[IU]/min via INTRAVENOUS
  Administered 2015-10-07: 4 m[IU]/min via INTRAVENOUS

## 2015-10-07 MED ORDER — LIDOCAINE HCL (PF) 1 % IJ SOLN
INTRAMUSCULAR | Status: DC | PRN
  Administered 2015-10-07 (×2): 8 mL via EPIDURAL

## 2015-10-07 MED ORDER — OXYCODONE-ACETAMINOPHEN 5-325 MG PO TABS
1.0000 | ORAL_TABLET | ORAL | Status: DC | PRN
Start: 2015-10-07 — End: 2015-10-07

## 2015-10-07 MED ORDER — DEXTROSE 5 % IV SOLN
5.0000 10*6.[IU] | Freq: Once | INTRAVENOUS | Status: AC
  Administered 2015-10-07: 5 10*6.[IU] via INTRAVENOUS
  Filled 2015-10-07: qty 5

## 2015-10-07 MED ORDER — OXYCODONE-ACETAMINOPHEN 5-325 MG PO TABS: 2.0000 | ORAL_TABLET | ORAL | Status: DC | PRN

## 2015-10-07 MED ORDER — ACETAMINOPHEN 325 MG PO TABS: 650.0000 mg | ORAL_TABLET | ORAL | Status: DC | PRN

## 2015-10-07 MED ORDER — PHENYLEPHRINE 40 MCG/ML (10ML) SYRINGE FOR IV PUSH (FOR BLOOD PRESSURE SUPPORT)
80.0000 ug | PREFILLED_SYRINGE | INTRAVENOUS | Status: DC | PRN
  Filled 2015-10-07: qty 20
  Filled 2015-10-07: qty 2

## 2015-10-07 MED ORDER — ONDANSETRON HCL 4 MG/2ML IJ SOLN: 4.0000 mg | Freq: Four times a day (QID) | INTRAMUSCULAR | Status: DC | PRN

## 2015-10-07 MED ORDER — DEXTROSE 5 % IV SOLN
2.5000 10*6.[IU] | INTRAVENOUS | Status: DC
  Administered 2015-10-07: 2.5 10*6.[IU] via INTRAVENOUS
  Filled 2015-10-07 (×4): qty 2.5

## 2015-10-07 MED ORDER — IBUPROFEN 600 MG PO TABS
600.0000 mg | ORAL_TABLET | Freq: Four times a day (QID) | ORAL | Status: DC
  Administered 2015-10-07 – 2015-10-09 (×6): 600 mg via ORAL
  Filled 2015-10-07 (×7): qty 1

## 2015-10-07 MED ORDER — SIMETHICONE 80 MG PO CHEW: 80.0000 mg | CHEWABLE_TABLET | ORAL | Status: DC | PRN

## 2015-10-07 MED ORDER — LANOLIN HYDROUS EX OINT: TOPICAL_OINTMENT | CUTANEOUS | Status: DC | PRN

## 2015-10-07 MED ORDER — BENZOCAINE-MENTHOL 20-0.5 % EX AERO
1.0000 "application " | INHALATION_SPRAY | CUTANEOUS | Status: DC | PRN
  Administered 2015-10-07: 1 via TOPICAL
  Filled 2015-10-07: qty 56

## 2015-10-07 MED ORDER — OXYTOCIN 40 UNITS IN LACTATED RINGERS INFUSION - SIMPLE MED
62.5000 mL/h | INTRAVENOUS | Status: DC
Start: 2015-10-07 — End: 2015-10-09

## 2015-10-07 MED ORDER — DIBUCAINE 1 % RE OINT: 1.0000 "application " | TOPICAL_OINTMENT | RECTAL | Status: DC | PRN

## 2015-10-07 MED ORDER — EPHEDRINE 5 MG/ML INJ
10.0000 mg | INTRAVENOUS | Status: DC | PRN
  Filled 2015-10-07: qty 2

## 2015-10-07 MED ORDER — TETANUS-DIPHTH-ACELL PERTUSSIS 5-2.5-18.5 LF-MCG/0.5 IM SUSP: 0.5000 mL | Freq: Once | INTRAMUSCULAR | Status: DC

## 2015-10-07 MED ORDER — OXYCODONE-ACETAMINOPHEN 5-325 MG PO TABS: 1.0000 | ORAL_TABLET | ORAL | Status: DC | PRN

## 2015-10-07 MED ORDER — CITRIC ACID-SODIUM CITRATE 334-500 MG/5ML PO SOLN: 30.0000 mL | ORAL | Status: DC | PRN

## 2015-10-07 MED ORDER — FENTANYL 2.5 MCG/ML BUPIVACAINE 1/10 % EPIDURAL INFUSION (WH - ANES)
14.0000 mL/h | INTRAMUSCULAR | Status: DC | PRN
  Administered 2015-10-07: 14 mL/h via EPIDURAL
  Filled 2015-10-07: qty 125

## 2015-10-07 MED ORDER — BUTORPHANOL TARTRATE 1 MG/ML IJ SOLN: 1.0000 mg | INTRAMUSCULAR | Status: DC | PRN

## 2015-10-07 NOTE — H&P (Signed)
19 y.o. G2P1001 @ 7132w4d presents with c/o ctx q2-4 minutes and LOF.  On exam, ROM r/o, but patient painfully contracting w cervical change.  Otherwise has good fetal movement and no bleeding.  Pregnancy complicated by: 1. Iron deficiency anemia--on po iron   Past Medical History  Diagnosis Date  . Anemia   . Vaginitis   . Shingles    History reviewed. No pertinent past surgical history.   OB History  Gravida Para Term Preterm AB SAB TAB Ectopic Multiple Living  2 1 1  0 0 0 0 0 0 1    # Outcome Date GA Lbr Len/2nd Weight Sex Delivery Anes PTL Lv  2 Current           1 Term 04/19/14 5367w0d 10:00 / 00:17 3.941 kg (8 lb 11 oz) F Vag-Spont EPI  Y      Social History   Social History  . Marital Status: Single    Spouse Name: N/A  . Number of Children: N/A  . Years of Education: N/A   Occupational History  . Not on file.   Social History Main Topics  . Smoking status: Former Smoker    Quit date: 08/20/2013  . Smokeless tobacco: Never Used  . Alcohol Use: No  . Drug Use: No  . Sexual Activity: Yes   Other Topics Concern  . Not on file   Social History Narrative            Review of patient's allergies indicates no known allergies.    Prenatal Transfer Tool  Maternal Diabetes: No Genetic Screening: Declined Maternal Ultrasounds/Referrals: Normal Fetal Ultrasounds or other Referrals:  None Maternal Substance Abuse:  No Significant Maternal Medications:  None Significant Maternal Lab Results: Lab values include: Group B Strep negative  ABO, Rh: --/--/O POS (12/31 1240) Antibody: NEG (12/31 1240) Rubella:Rubella RPR: Nonreactive (06/29 0000)  HBsAg: Negative (06/29 0000)  HIV: Non-reactive (06/29 0000)  GBS: Positive (12/02 0000)     Filed Vitals:   10/07/15 1701 10/07/15 1731  BP: 125/60 114/55  Pulse: 74 85  Temp:    Resp: 18 18     General:  NAD Abdomen:  soft, gravid, EFW 9# Ex:  no edema SVE:  8/90/BBOW per RN FHTs:  120s, mod var, + accels,  AROM clear fluid.  Toco:  q2-6 min   A/P   19 y.o. G2P1001 4632w4d presents with active labor Admit to L&D Contractions spacing--start pitocin GBS positive-PCN EFW 9#.  PP 8#11oz, shoulder precautions at delivery   [facility] Herrera Anita Herrera

## 2015-10-07 NOTE — Anesthesia Procedure Notes (Signed)
Epidural Patient location during procedure: OB Start time: 10/07/2015 4:24 PM End time: 10/07/2015 4:28 PM  Staffing Anesthesiologist: Leilani AbleHATCHETT, Samyiah Halvorsen Performed by: anesthesiologist   Preanesthetic Checklist Completed: patient identified, surgical consent, pre-op evaluation, timeout performed, IV checked, risks and benefits discussed and monitors and equipment checked  Epidural Patient position: sitting Prep: site prepped and draped and DuraPrep Patient monitoring: continuous pulse ox and blood pressure Approach: midline Location: L3-L4 Injection technique: LOR air  Needle:  Needle type: Tuohy  Needle gauge: 17 G Needle length: 9 cm and 9 Needle insertion depth: 6 cm Catheter type: closed end flexible Catheter size: 19 Gauge Catheter at skin depth: 11 cm Test dose: negative and Other  Assessment Sensory level: T9 Events: blood not aspirated, injection not painful, no injection resistance, negative IV test and no paresthesia  Additional Notes Reason for block:procedure for pain

## 2015-10-07 NOTE — MAU Note (Signed)
Lower belly cramping, leaking yellow mucous with a little spotting

## 2015-10-07 NOTE — Anesthesia Preprocedure Evaluation (Signed)
Anesthesia Evaluation  Patient identified by MRN, date of birth, ID band Patient awake    Reviewed: Allergy & Precautions, H&P , NPO status , Patient's Chart, lab work & pertinent test results  Airway Mallampati: II  TM Distance: >3 FB Neck ROM: full    Dental no notable dental hx.    Pulmonary former smoker,    Pulmonary exam normal       Cardiovascular negative cardio ROS Normal cardiovascular exam    Neuro/Psych negative neurological ROS  negative psych ROS   GI/Hepatic negative GI ROS, Neg liver ROS,   Endo/Other  negative endocrine ROS  Renal/GU negative Renal ROS     Musculoskeletal   Abdominal (+) + obese,   Peds  Hematology negative hematology ROS (+)   Anesthesia Other Findings   Reproductive/Obstetrics (+) Pregnancy                             Anesthesia Physical Anesthesia Plan  ASA: II  Anesthesia Plan: Epidural   Post-op Pain Management:    Induction:   Airway Management Planned:   Additional Equipment:   Intra-op Plan:   Post-operative Plan:   Informed Consent: I have reviewed the patients History and Physical, chart, labs and discussed the procedure including the risks, benefits and alternatives for the proposed anesthesia with the patient or authorized representative who has indicated his/her understanding and acceptance.     Plan Discussed with:   Anesthesia Plan Comments:         Anesthesia Quick Evaluation  

## 2015-10-08 LAB — CBC
HCT: 29.4 % — ABNORMAL LOW (ref 36.0–46.0)
Hemoglobin: 9.2 g/dL — ABNORMAL LOW (ref 12.0–15.0)
MCH: 27 pg (ref 26.0–34.0)
MCHC: 31.3 g/dL (ref 30.0–36.0)
MCV: 86.2 fL (ref 78.0–100.0)
PLATELETS: 194 10*3/uL (ref 150–400)
RBC: 3.41 MIL/uL — ABNORMAL LOW (ref 3.87–5.11)
RDW: 14.4 % (ref 11.5–15.5)
WBC: 9.7 10*3/uL (ref 4.0–10.5)

## 2015-10-08 LAB — RPR: RPR Ser Ql: NONREACTIVE

## 2015-10-08 NOTE — Progress Notes (Signed)
Patient has been asked whether she'd like to see lactation and has refused twice Has a 641.22 year old at home with whom breastfeeding experience was not pleasant (baby tongue tied). Has no interest in putting baby to breast further.  Has been taught hand expression; nurse expressed a few drops when patient first came to M/B, but patieet has been formula feeding since.

## 2015-10-08 NOTE — Anesthesia Postprocedure Evaluation (Signed)
Anesthesia Post Note  Patient: Anita Herrera  Procedure(s) Performed: * No procedures listed *  Patient location during evaluation: Mother Baby Anesthesia Type: Epidural Level of consciousness: awake and alert, oriented and patient cooperative Pain management: pain level controlled Vital Signs Assessment: post-procedure vital signs reviewed and stable Respiratory status: spontaneous breathing Cardiovascular status: stable Postop Assessment: no headache, epidural receding, patient able to bend at knees and no signs of nausea or vomiting Anesthetic complications: no    Last Vitals:  Filed Vitals:   10/08/15 0130 10/08/15 0552  BP: 121/56 114/54  Pulse: 75 65  Temp: 36.8 C 36.4 C  Resp: 18 18    Last Pain:  Filed Vitals:   10/08/15 0655  PainSc: Asleep                 Merrilyn PumaWRINKLE,Jerret Mcbane

## 2015-10-08 NOTE — Progress Notes (Signed)
Patient is doing well.  She is ambulating, voiding, tolerating PO.  Pain control is good.  Lochia is appropriate  Filed Vitals:   10/07/15 2030 10/07/15 2130 10/08/15 0130 10/08/15 0552  BP: 132/78 113/68 121/56 114/54  Pulse: 87 85 75 65  Temp: 98.5 F (36.9 C) 97.8 F (36.6 C) 98.3 F (36.8 C) 97.6 F (36.4 C)  TempSrc:    Oral  Resp: 18 18 18 18   Height:      Weight:      SpO2: 98% 99%      NAD Fundus firm Ext: trace edema b/l  Lab Results  Component Value Date   WBC 9.7 10/08/2015   HGB 9.2* 10/08/2015   HCT 29.4* 10/08/2015   MCV 86.2 10/08/2015   PLT 194 10/08/2015    --/--/O POS (12/31 1240)/RImmune  A/P 19 y.o. [id number] PPD#1 s/p TSVD. Routine care.   Expect d/c tomorrow.     [facility] GEFFEL [employer] Tim[employer] CompanyCLARK

## 2015-10-09 MED ORDER — DOCUSATE SODIUM 100 MG PO CAPS: 100.0000 mg | ORAL_CAPSULE | Freq: Two times a day (BID) | ORAL | Status: DC

## 2015-10-09 MED ORDER — OXYCODONE-ACETAMINOPHEN 5-325 MG PO TABS: 1.0000 | ORAL_TABLET | ORAL | Status: DC | PRN

## 2015-10-09 MED ORDER — IBUPROFEN 600 MG PO TABS: 600.0000 mg | ORAL_TABLET | Freq: Four times a day (QID) | ORAL | Status: DC | PRN

## 2015-10-09 NOTE — Progress Notes (Signed)
Discharge papers and instructions discussed with patient. Denies questions at this time. 

## 2015-10-09 NOTE — Progress Notes (Signed)
UR chart review completed.  

## 2015-10-09 NOTE — Discharge Summary (Signed)
Obstetric Discharge Summary Reason for Admission: onset of labor Prenatal Procedures: NST and ultrasound Intrapartum Procedures: spontaneous vaginal delivery Postpartum Procedures: none Complications-Operative and Postpartum: none HEMOGLOBIN  Date Value Ref Range Status  10/08/2015 9.2* 12.0 - 15.0 g/dL Final   HCT  Date Value Ref Range Status  10/08/2015 29.4* 36.0 - 46.0 % Final    Physical Exam:  General: alert, cooperative and appears stated age 1Lochia: appropriate Uterine Fundus: firm   Discharge Diagnoses: Term Pregnancy-delivered  Discharge Information: Date: 10/09/2015 Activity: pelvic rest Diet: routine Medications: Ibuprofen, Colace and Percocet Condition: improved Instructions: refer to practice specific booklet Discharge to: home Follow-up Information    [facility] Chestine SporeLARK, MD In 4 weeks.   Specialty:  Obstetrics   Why:  For a postpartum evaluation   Contact information:   [address] [place] [address] [phone]       Newborn Data: Live born female  Birth Weight: 9 lb 5.2 oz (4230 g) APGAR: 7, 8  Home with mother.  Mak Bonny H. 10/09/2015, 10:02 AM

## 2015-10-11 ENCOUNTER — Inpatient Hospital Stay (HOSPITAL_COMMUNITY): Admission: RE | Admit: 2015-10-11 | Payer: Medicaid Other | Source: Ambulatory Visit

## 2015-10-12 ENCOUNTER — Inpatient Hospital Stay (HOSPITAL_COMMUNITY): Payer: Medicaid Other

## 2015-10-12 ENCOUNTER — Encounter (HOSPITAL_COMMUNITY): Payer: Medicaid Other

## 2018-03-11 ENCOUNTER — Encounter (HOSPITAL_COMMUNITY): Payer: Self-pay | Admitting: Emergency Medicine

## 2018-03-11 ENCOUNTER — Emergency Department (HOSPITAL_COMMUNITY): Payer: Self-pay

## 2018-03-11 ENCOUNTER — Emergency Department (HOSPITAL_COMMUNITY)
Admission: EM | Admit: 2018-03-11 | Discharge: 2018-03-11 | Disposition: A | Discharge status: Home / Self Care | Payer: Self-pay | Attending: Physician Assistant | Admitting: Physician Assistant

## 2018-03-11 DIAGNOSIS — R0789 Other chest pain: Secondary | ICD-10-CM | POA: Insufficient documentation

## 2018-03-11 DIAGNOSIS — F17210 Nicotine dependence, cigarettes, uncomplicated: Secondary | ICD-10-CM | POA: Insufficient documentation

## 2018-03-11 LAB — CBC
HEMATOCRIT: 38 % (ref 36.0–46.0)
Hemoglobin: 12.5 g/dL (ref 12.0–15.0)
MCH: 32.7 pg (ref 26.0–34.0)
MCHC: 32.9 g/dL (ref 30.0–36.0)
MCV: 99.5 fL (ref 78.0–100.0)
Platelets: 214 10*3/uL (ref 150–400)
RBC: 3.82 MIL/uL — ABNORMAL LOW (ref 3.87–5.11)
RDW: 12.3 % (ref 11.5–15.5)
WBC: 4.9 10*3/uL (ref 4.0–10.5)

## 2018-03-11 LAB — BASIC METABOLIC PANEL
Anion gap: 7 (ref 5–15)
BUN: 7 mg/dL (ref 6–20)
CO2: 26 mmol/L (ref 22–32)
Calcium: 8.8 mg/dL — ABNORMAL LOW (ref 8.9–10.3)
Chloride: 107 mmol/L (ref 101–111)
Creatinine, Ser: 0.53 mg/dL (ref 0.44–1.00)
GFR calc Af Amer: >60 mL/min (ref >60)
GFR calc non Af Amer: >60 mL/min (ref >60)
GLUCOSE: 80 mg/dL (ref 65–99)
POTASSIUM: 3.7 mmol/L (ref 3.5–5.1)
Sodium: 140 mmol/L (ref 135–145)

## 2018-03-11 LAB — D-DIMER, QUANTITATIVE: D-Dimer, Quant: 0.36 ug/mL-FEU (ref 0.00–0.50)

## 2018-03-11 LAB — I-STAT BETA HCG BLOOD, ED (MC, WL, AP ONLY): I-stat hCG, quantitative: <5 m[IU]/mL (ref <5)

## 2018-03-11 LAB — I-STAT TROPONIN, ED: Troponin i, poc: 0 ng/mL (ref 0.00–0.08)

## 2018-03-11 NOTE — Discharge Instructions (Signed)
You can take Tylenol or Ibuprofen as directed for pain. You can alternate Tylenol and Ibuprofen every 4 hours. If you take Tylenol at 1pm, then you can take Ibuprofen at 5pm. Then you can take Tylenol again at 9pm.   [facility] for further primary care establishment and evaluation.  Return to the Emergency Department immediately if you experiencing worsening chest pain, difficulty breathing, nausea/vomiting, get very sweaty, headache or any other worsening or concerning symptoms.

## 2018-03-11 NOTE — ED Provider Notes (Signed)
[facility]-EMERGENCY DEPT Provider Note   CSN: [id number] Arrival date & time: 03/11/18  0915     History   Chief Complaint Chief Complaint  Patient presents with  . Chest Pain    HPI Anita Herrera is a 22 y.o. female personal history of anemia, shingles who presents for evaluation of intermittent chest pain that is been ongoing for last 3 days.  Patient reports that it is on the left side underneath the breast.  She states that sometimes it radiates across her chest to the right side.  Patient states that the pain occurs at random times and that there is no specific action that triggers the pain.  She states that she does not get nauseous or diaphoretic with the pain.  She describes it as a "sharp, pressure."  She states it is not worse with deep inspiration or with exertion.  She has not had any associated difficulty breathing.  Patient states she has not taken anything for the pain.  He denies any preceding trauma, injury, fall.  Denies any alleviating or aggravating factors.  At this time in the ED, she is currently not having any pain.  Patient states that she was just worried and wanted to get it checked out and could not get an appointment anywhere else.  Patient denies any personal or family cardiac history.  Patient does report that she smokes cigarettes.  She states one pack lasts her 2 to 3 days.  Denies any cocaine use.  Patient denies any history of high blood pressure, diabetes.  Patient is currently on oral birth control pills.  She denies any recent long trips, immobilizations, surgeries, hospitalizations, history of blood clots in her legs or lungs, leg swelling.  Patient denies any fevers, abdominal pain, vomiting.  The history is provided by the patient.    Past Medical History:  Diagnosis Date  . Anemia   . Shingles   . Vaginitis     Patient Active Problem List   Diagnosis Date Noted  . Active labor at term 10/07/2015  . Active labor  04/19/2014  . Postpartum state 04/19/2014    History reviewed. No pertinent surgical history.   OB History    Gravida  2   Para  2   Term  2   Preterm  0   AB  0   Living  2     SAB  0   TAB  0   Ectopic  0   Multiple  0   Live Births  2            Home Medications    Prior to Admission medications   Medication Sig Start Date End Date Taking? Authorizing Provider  docusate sodium (COLACE) 100 MG capsule Take 1 capsule (100 mg total) by mouth 2 (two) times daily. Patient not taking: Reported on 03/11/2018 10/09/15   Waynard Reeds, MD  ibuprofen (ADVIL,MOTRIN) 600 MG tablet Take 1 tablet (600 mg total) by mouth every 6 (six) hours as needed. Patient not taking: Reported on 03/11/2018 10/09/15   Waynard Reeds, MD  oxyCODONE-acetaminophen (ROXICET) 5-325 MG tablet Take 1-2 tablets by mouth every 4 (four) hours as needed for severe pain. Patient not taking: Reported on 03/11/2018 10/09/15   Waynard Reeds, MD    Family History No family history on file.  Social History Social History   Tobacco Use  . Smoking status: Former Smoker    Last attempt to quit: 08/20/2013    Years  since quitting: 4.5  . Smokeless tobacco: Never Used  Substance Use Topics  . Alcohol use: No    Alcohol/week: 0.0 oz  . Drug use: No     Allergies   Patient has no known allergies.   Review of Systems Review of Systems  Constitutional: Negative for fever.  Respiratory: Negative for cough and shortness of breath.   Cardiovascular: Positive for chest pain.  Gastrointestinal: Negative for abdominal pain, nausea and vomiting.  Genitourinary: Negative for dysuria and hematuria.  Neurological: Negative for headaches.  All other systems reviewed and are negative.    Physical Exam Updated Vital Signs BP 132/80 (BP Location: Right Arm)   Pulse 65   Temp 98 F (36.7 C)   Resp 17   Ht 5\' 6"  (1.676 m)   Wt 97.1 kg (214 lb)   LMP 03/05/2018 (Approximate)   SpO2 100%   BMI 34.54 kg/m    Physical Exam  Constitutional: She is oriented to person, place, and time. She appears well-developed and well-nourished.  HENT:  Head: Normocephalic and atraumatic.  Mouth/Throat: Oropharynx is clear and moist and mucous membranes are normal.  Eyes: Pupils are equal, round, and reactive to light. Conjunctivae, EOM and lids are normal.  Neck: Full passive range of motion without pain.  Cardiovascular: Normal rate, regular rhythm, normal heart sounds and normal pulses. Exam reveals no gallop and no friction rub.  No murmur heard. Pulses:      Radial pulses are 2+ on the right side, and 2+ on the left side.       Dorsalis pedis pulses are 2+ on the right side, and 2+ on the left side.  Pulmonary/Chest: Effort normal and breath sounds normal.  Lungs clear to auscultation bilaterally.  Symmetric chest rise.  No wheezing, rales, rhonchi.  Abdominal: Soft. Normal appearance. There is no tenderness. There is no rigidity and no guarding.  Abdomen is soft, non-distended, non-tender. No rigidity, No guarding. No peritoneal signs.  Musculoskeletal: Normal range of motion.  Neurological: She is alert and oriented to person, place, and time.  Skin: Skin is warm and dry. Capillary refill takes less than 2 seconds.  Psychiatric: She has a normal mood and affect. Her speech is normal.  Nursing note and vitals reviewed.    ED Treatments / Results  Labs (all labs ordered are listed, but only abnormal results are displayed) Labs Reviewed  CBC - Abnormal; Notable for the following components:      Result Value   RBC 3.82 (*)    All other components within normal limits  BASIC METABOLIC PANEL  I-STAT TROPONIN, ED  I-STAT BETA HCG BLOOD, ED ([facility], [facility], [facility] ONLY)    EKG None  Radiology Dg Chest 2 View  Result Date: 03/11/2018 CLINICAL DATA:  Chest tightness EXAM: CHEST - 2 VIEW COMPARISON:  None. FINDINGS: Lungs are clear. Heart size and pulmonary vascularity are normal. No adenopathy. No  pneumothorax. There is thoracolumbar levoscoliosis. IMPRESSION: No edema or consolidation. Electronically Signed   By: Bretta BangWilliam  Woodruff III M.D.   On: 03/11/2018 10:08    Procedures Procedures (including critical care time)  Medications Ordered in ED Medications - No data to display   Initial Impression / Assessment and Plan / ED Course  I have reviewed the triage vital signs and the nursing notes.  Pertinent labs & imaging results that were available during my care of the patient were reviewed by me and considered in my medical decision making (see chart for details).  22 y.o. F with past medical history who presents for evaluation of 3 days of intermittent chest pain.  Reports there is no triggering factor.  Not worse with exertion or deep inspiration.  No associated shortness of breath.  Currently denies any pain states that she wanted to get checked out.  No personal cardiac history, family cardiac history.  No drug use.  She does report that she smokes.  Currently on OCPs.  No other PE risk factors. Patient is afebrile, non-toxic appearing, sitting comfortably on examination table. Vital signs reviewed and stable. Patient not currently having any symptoms. Exam unremarkable. Low suspicion for ACS etiology given patient's atypical story but a consideration. Also consider infectious etiology vs electrolyte abnormality. Low suspicion of PE given history/phhysical exam but patient is on OCPs which makes her at risk. Plan to check basic labs, EKG, CXR.   EKG shows NSR 65, borderline T wave abnormalities. Trop negative. Beta negative. D-dimer negative. BMP unremarkable. CBC without any significant leukocytosis or anemia. CXR unremarkable.   Discussed results with patient.  Patient is not having any pain at this time.  Presentation is very typical for ACS etiology.  Additionally, her only risk factor for ACS etiology is smoking.  Given duration of symptoms, once troponin is sufficient for  ACS rule out.  Patient does not have a primary care doctor to follow-up with.  We will give her outpatient [facility] referral for further primary care evaluation.  Discussed supportive at home therapies. Patient had ample opportunity for questions and discussion. All patient's questions were answered with full understanding. Strict return precautions discussed. Patient expresses understanding and agreement to plan.   Final Clinical Impressions(s) / ED Diagnoses   Final diagnoses:  None    ED Discharge Orders    None       Maxwell Caul, PA-C 03/12/18 1149    Mackuen, Cindee Salt, MD 03/12/18 1154

## 2018-03-11 NOTE — ED Triage Notes (Signed)
Patient here from home with complaints of chest pain. Mid upper radiating under left breast. Denies nausea and vomiting.

## 2019-03-09 ENCOUNTER — Other Ambulatory Visit: Payer: Self-pay

## 2019-03-09 ENCOUNTER — Ambulatory Visit
Admission: EM | Admit: 2019-03-09 | Discharge: 2019-03-09 | Disposition: A | Discharge status: Home / Self Care | Payer: Self-pay | Attending: Physician Assistant | Admitting: Physician Assistant

## 2019-03-09 DIAGNOSIS — M545 Low back pain, unspecified: Secondary | ICD-10-CM

## 2019-03-09 MED ORDER — MELOXICAM 7.5 MG PO TABS: 7.5000 mg | ORAL_TABLET | Freq: Every day | ORAL | 0 refills | Status: DC

## 2019-03-09 MED ORDER — METHOCARBAMOL 500 MG PO TABS: 500.0000 mg | ORAL_TABLET | Freq: Two times a day (BID) | ORAL | 0 refills | Status: DC

## 2019-03-09 NOTE — ED Triage Notes (Addendum)
Pt states was at work today, bent over to pick up a mini vac and had sharp pains to lower back, radiating down both legs

## 2019-03-09 NOTE — Discharge Instructions (Signed)
Start Mobic. Do not take ibuprofen (motrin/advil)/ naproxen (aleve) while on mobic. Robaxin as needed, this can make you drowsy, so do not take if you are going to drive, operate heavy machinery, or make important decisions. Ice/heat compresses as needed. This can take up to 3-4 weeks to completely resolve, but you should be feeling better each week. Follow up here or with PCP if symptoms worsen, changes for reevaluation. If experience numbness/tingling of the inner thighs, loss of bladder or bowel control, go to the emergency department for evaluation.  ° °

## 2019-03-09 NOTE — ED Provider Notes (Signed)
[name]    CSN: [id number] Arrival date & time: 03/09/19  1251     History   Chief Complaint Chief Complaint  Patient presents with   Back Pain    HPI Anita Herrera is a 23 y.o. female.   23 year old female who works at [employer] comes in for low back pain occurring while at work earlier today when bending over to pick up a mini vac. States she was bending to pick up the vac when she felt pain to bilateral back. She walked around for a bit and tried for a second time, and felt a spasm, and had pain going down bilateral buttocks. She denies saddle anesthesia, loss of bladder or bowel control. She called management and was told to come in for evaluation. 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  She states sitting is improving the pain, though changes in position causes the pain to be worse. She took tylenol without relief. Denies history of back problems.      Past Medical History:  Diagnosis Date   Anemia    Shingles    Vaginitis     Patient Active Problem List   Diagnosis Date Noted   Active labor at term 10/07/2015   Active labor 04/19/2014   Postpartum state 04/19/2014    History reviewed. No pertinent surgical history.  OB History    Gravida  2   Para  2   Term  2   Preterm  0   AB  0   Living  2     SAB  0   TAB  0   Ectopic  0   Multiple  0   Live Births  2            Home Medications    Prior to Admission medications   Medication Sig Start Date End Date Taking? Authorizing Provider  meloxicam (MOBIC) 7.5 MG tablet Take 1 tablet (7.5 mg total) by mouth daily. 03/09/19   Cathie Hoops, Samirah Scarpati V, PA-C  methocarbamol (ROBAXIN) 500 MG tablet Take 1 tablet (500 mg total) by mouth 2 (two) times daily. 03/09/19   Cathie Hoops, Aundrea Horace V, PA-C  norethindrone-ethinyl estradiol (OVCON-35,BALZIVA,BRIELLYN) 0.4-35 MG-MCG tablet Take 1 tablet by mouth daily.    [provider]    Family History No family history on file.  Social  History Social History   Tobacco Use   Smoking status: Former Smoker    Last attempt to quit: 08/20/2013    Years since quitting: 5.5   Smokeless tobacco: Never Used  Substance Use Topics   Alcohol use: No    Alcohol/week: 0.0 standard drinks   Drug use: No     Allergies   Patient has no known allergies.   Review of Systems Review of Systems  Reason unable to perform ROS: See HPI as above.     Physical Exam Triage Vital Signs ED Triage Vitals [03/09/19 1302]  Enc Vitals Group     BP (!) 151/76     Pulse Rate 72     Resp 18     Temp 98.7 F (37.1 C)     Temp Source Oral     SpO2 97 %     Weight      Height      Head Circumference      Peak Flow      Pain Score 6     Pain Loc      Pain Edu?  Excl. in [place]?    No data found.  Updated Vital Signs BP (!) 151/76 (BP Location: Left Arm)    Pulse 72    Temp 98.7 F (37.1 C) (Oral)    Resp 18    LMP 03/08/2019    SpO2 97%   Physical Exam Constitutional:      General: She is not in acute distress.    Appearance: She is well-developed. She is not diaphoretic.  HENT:     Head: Normocephalic and atraumatic.  Eyes:     Conjunctiva/sclera: Conjunctivae normal.     Pupils: Pupils are equal, round, and reactive to light.  Cardiovascular:     Rate and Rhythm: Normal rate and regular rhythm.     Heart sounds: Normal heart sounds. No murmur. No friction rub. No gallop.   Pulmonary:     Effort: Pulmonary effort is normal. No accessory muscle usage or respiratory distress.     Breath sounds: Normal breath sounds. No stridor. No decreased breath sounds, wheezing, rhonchi or rales.  Musculoskeletal:     Comments: No tenderness on palpation of the spinous processes. Tenderness to palpation of bilateral paraspinal muscles of the lumbar region. Full range of motion of back and hips. Strength normal and equal bilaterally. Sensation intact and equal bilaterally. Negative straight leg raise. Pedal pulses 2+ and equal  bilaterally.   Skin:    General: Skin is warm and dry.  Neurological:     Mental Status: She is alert and oriented to person, place, and time.      UC Treatments / Results  Labs (all labs ordered are listed, but only abnormal results are displayed) Labs Reviewed - No data to display  EKG None  Radiology No results found.  Procedures Procedures (including critical care time)  Medications Ordered in UC Medications - No data to display  Initial Impression / Assessment and Plan / UC Course  I have reviewed the triage vital signs and the nursing notes.  Pertinent labs & imaging results that were available during my care of the patient were reviewed by me and considered in my medical decision making (see chart for details).    Start NSAID as directed for pain and inflammation. Muscle relaxant as needed. Ice/heat compresses. Discussed with patient strain can take up to 3-4 weeks to resolve, but should be getting better each week. Return precautions given.  Discussed with patient, if filing as worker's comp, will need to follow up with occupational health for paperwork and further management. Resources provided. Patient expresses understanding and agrees to plan.   Final Clinical Impressions(s) / UC Diagnoses   Final diagnoses:  Acute bilateral low back pain without sciatica    ED Prescriptions    Medication Sig Dispense Auth. Provider   meloxicam (MOBIC) 7.5 MG tablet Take 1 tablet (7.5 mg total) by mouth daily. 10 tablet Nubia Ziesmer V, PA-C   methocarbamol (ROBAXIN) 500 MG tablet Take 1 tablet (500 mg total) by mouth 2 (two) times daily. 20 tablet Threasa AlphaYu, Colyn Miron V, PA-C        Tiffinie Caillier V, [place] JerseyPA-C 03/09/19 1335

## 2019-07-12 ENCOUNTER — Ambulatory Visit (HOSPITAL_COMMUNITY)
Admission: EM | Admit: 2019-07-12 | Discharge: 2019-07-12 | Disposition: A | Discharge status: Home / Self Care | Payer: Medicaid Other | Attending: Emergency Medicine | Admitting: Emergency Medicine

## 2019-07-12 ENCOUNTER — Encounter (HOSPITAL_COMMUNITY): Payer: Self-pay

## 2019-07-12 ENCOUNTER — Other Ambulatory Visit: Payer: Self-pay

## 2019-07-12 DIAGNOSIS — M25562 Pain in left knee: Secondary | ICD-10-CM

## 2019-07-12 NOTE — ED Provider Notes (Signed)
[facility]    CSN: [id number] Arrival date & time: 07/12/19  1025      History   Chief Complaint Chief Complaint  Patient presents with  . Knee Pain    HPI Anita Herrera is a 23 y.o. female.   Patient presents with pain in her left knee x1 week.  The pain began after she slipped and fell while playing outside with her children 1 week ago.  It is a dull pain on the medial side which she rates as 6/10;  Worse with bending; improves with rest.  She denies numbness, tingling, weakness.  She has been treating this at home with ibuprofen with good relief.  LMP: 06/17/2019.    The history is provided by the patient.    Past Medical History:  Diagnosis Date  . Anemia   . Shingles   . Vaginitis     Patient Active Problem List   Diagnosis Date Noted  . Active labor at term 10/07/2015  . Active labor 04/19/2014  . Postpartum state 04/19/2014    History reviewed. No pertinent surgical history.  OB History    Gravida  2   Para  2   Term  2   Preterm  0   AB  0   Living  2     SAB  0   TAB  0   Ectopic  0   Multiple  0   Live Births  2            Home Medications    Prior to Admission medications   Medication Sig Start Date End Date Taking? Authorizing Provider  meloxicam (MOBIC) 7.5 MG tablet Take 1 tablet (7.5 mg total) by mouth daily. 03/09/19   Cathie Hoops, Amy V, PA-C  methocarbamol (ROBAXIN) 500 MG tablet Take 1 tablet (500 mg total) by mouth 2 (two) times daily. 03/09/19   Cathie Hoops, Amy V, PA-C  norethindrone-ethinyl estradiol (OVCON-35,BALZIVA,BRIELLYN) 0.4-35 MG-MCG tablet Take 1 tablet by mouth daily.    [provider]    Family History History reviewed. No pertinent family history.  Social History Social History   Tobacco Use  . Smoking status: Former Smoker    Quit date: 08/20/2013    Years since quitting: 5.8  . Smokeless tobacco: Never Used  Substance Use Topics  . Alcohol use: No    Alcohol/week: 0.0 standard drinks  .  Drug use: No     Allergies   Patient has no known allergies.   Review of Systems Review of Systems  Constitutional: Negative for chills and fever.  HENT: Negative for ear pain and sore throat.   Eyes: Negative for pain and visual disturbance.  Respiratory: Negative for cough and shortness of breath.   Cardiovascular: Negative for chest pain and palpitations.  Gastrointestinal: Negative for abdominal pain and vomiting.  Genitourinary: Negative for dysuria and hematuria.  Musculoskeletal: Positive for arthralgias. Negative for back pain.  Skin: Negative for color change and rash.  Neurological: Negative for seizures, syncope, weakness and numbness.  All other systems reviewed and are negative.    Physical Exam Triage Vital Signs ED Triage Vitals  Enc Vitals Group     BP 07/12/19 1059 140/73     Pulse Rate 07/12/19 1059 88     Resp 07/12/19 1059 18     Temp 07/12/19 1059 98.4 F (36.9 C)     Temp src --      SpO2 07/12/19 1059 100 %  Weight 07/12/19 1057 240 lb (108.9 kg)     Height --      Head Circumference --      Peak Flow --      Pain Score 07/12/19 1057 6     Pain Loc --      Pain Edu? --      Excl. in [place]? --    No data found.  Updated Vital Signs BP 140/73 (BP Location: Right Arm)   Pulse 88   Temp 98.4 F (36.9 C)   Resp 18   Wt 240 lb (108.9 kg)   LMP 06/17/2019   SpO2 100%   BMI 38.74 kg/m   Visual Acuity Right Eye Distance:   Left Eye Distance:   Bilateral Distance:    Right Eye Near:   Left Eye Near:    Bilateral Near:     Physical Exam Vitals signs and nursing note reviewed.  Constitutional:      General: She is not in acute distress.    Appearance: She is well-developed.  HENT:     Head: Normocephalic and atraumatic.  Eyes:     Conjunctiva/sclera: Conjunctivae normal.  Neck:     Musculoskeletal: Neck supple.  Cardiovascular:     Rate and Rhythm: Normal rate and regular rhythm.     Heart sounds: No murmur.  Pulmonary:      Effort: Pulmonary effort is normal. No respiratory distress.     Breath sounds: Normal breath sounds.  Abdominal:     Palpations: Abdomen is soft.     Tenderness: There is no abdominal tenderness.  Musculoskeletal: Normal range of motion.        General: No swelling, tenderness or deformity.  Skin:    General: Skin is warm and dry.     Capillary Refill: Capillary refill takes less than 2 seconds.     Findings: No bruising, erythema, lesion or rash.  Neurological:     General: No focal deficit present.     Mental Status: She is alert and oriented to person, place, and time.     Sensory: No sensory deficit.     Motor: No weakness.     Gait: Gait normal.      UC Treatments / Results  Labs (all labs ordered are listed, but only abnormal results are displayed) Labs Reviewed - No data to display  EKG   Radiology No results found.  Procedures Procedures (including critical care time)  Medications Ordered in UC Medications - No data to display  Initial Impression / Assessment and Plan / UC Course  I have reviewed the triage vital signs and the nursing notes.  Pertinent labs & imaging results that were available during my care of the patient were reviewed by me and considered in my medical decision making (see chart for details).    Acute left knee pain.  Treating with ibuprofen, RICE, ace wrap as needed for comfort.  Instructed patient to follow up with an orthopedist if her pain continues or she develops paresthesias, weakness, numbness or other symptoms.  She agrees to plan of care.     Final Clinical Impressions(s) / UC Diagnoses   Final diagnoses:  Acute pain of left knee     Discharge Instructions     Take the ibuprofen as needed.  Rest and elevate your knee.  Apply ice packs 2-3 times a day for up to 20 minutes each.  Wear the Ace wrap as needed for comfort.    Follow up with  your primary care provider or an orthopedist if you symptoms continue or worsen;  Or  if you develop new symptoms, such as numbness, tingling, or weakness.         ED Prescriptions    None     I have reviewed the PDMP during this encounter.   Mickie Bailate, Tayshon Winker H, NP 07/12/19 1130

## 2019-07-12 NOTE — ED Triage Notes (Signed)
Pt states she fell on her left knee a week ago.

## 2019-07-12 NOTE — Discharge Instructions (Addendum)
Take the ibuprofen as needed.  Rest and elevate your knee.  Apply ice packs 2-3 times a day for up to 20 minutes each.  Wear the Ace wrap as needed for comfort.    Follow up with your primary care provider or an orthopedist if you symptoms continue or worsen;  Or if you develop new symptoms, such as numbness, tingling, or weakness.

## 2019-09-07 ENCOUNTER — Other Ambulatory Visit: Payer: Self-pay

## 2019-09-07 DIAGNOSIS — Z20822 Contact with and (suspected) exposure to covid-19: Secondary | ICD-10-CM

## 2019-09-09 LAB — NOVEL CORONAVIRUS, NAA: SARS-CoV-2, NAA: NOT DETECTED

## 2020-03-26 IMAGING — DX DG CHEST 2V
2 series · 2 of 2 positions shown · non-contrast
Comparison: None.

CLINICAL DATA: Chest tightness

EXAM:
CHEST - 2 VIEW

[chest pa]
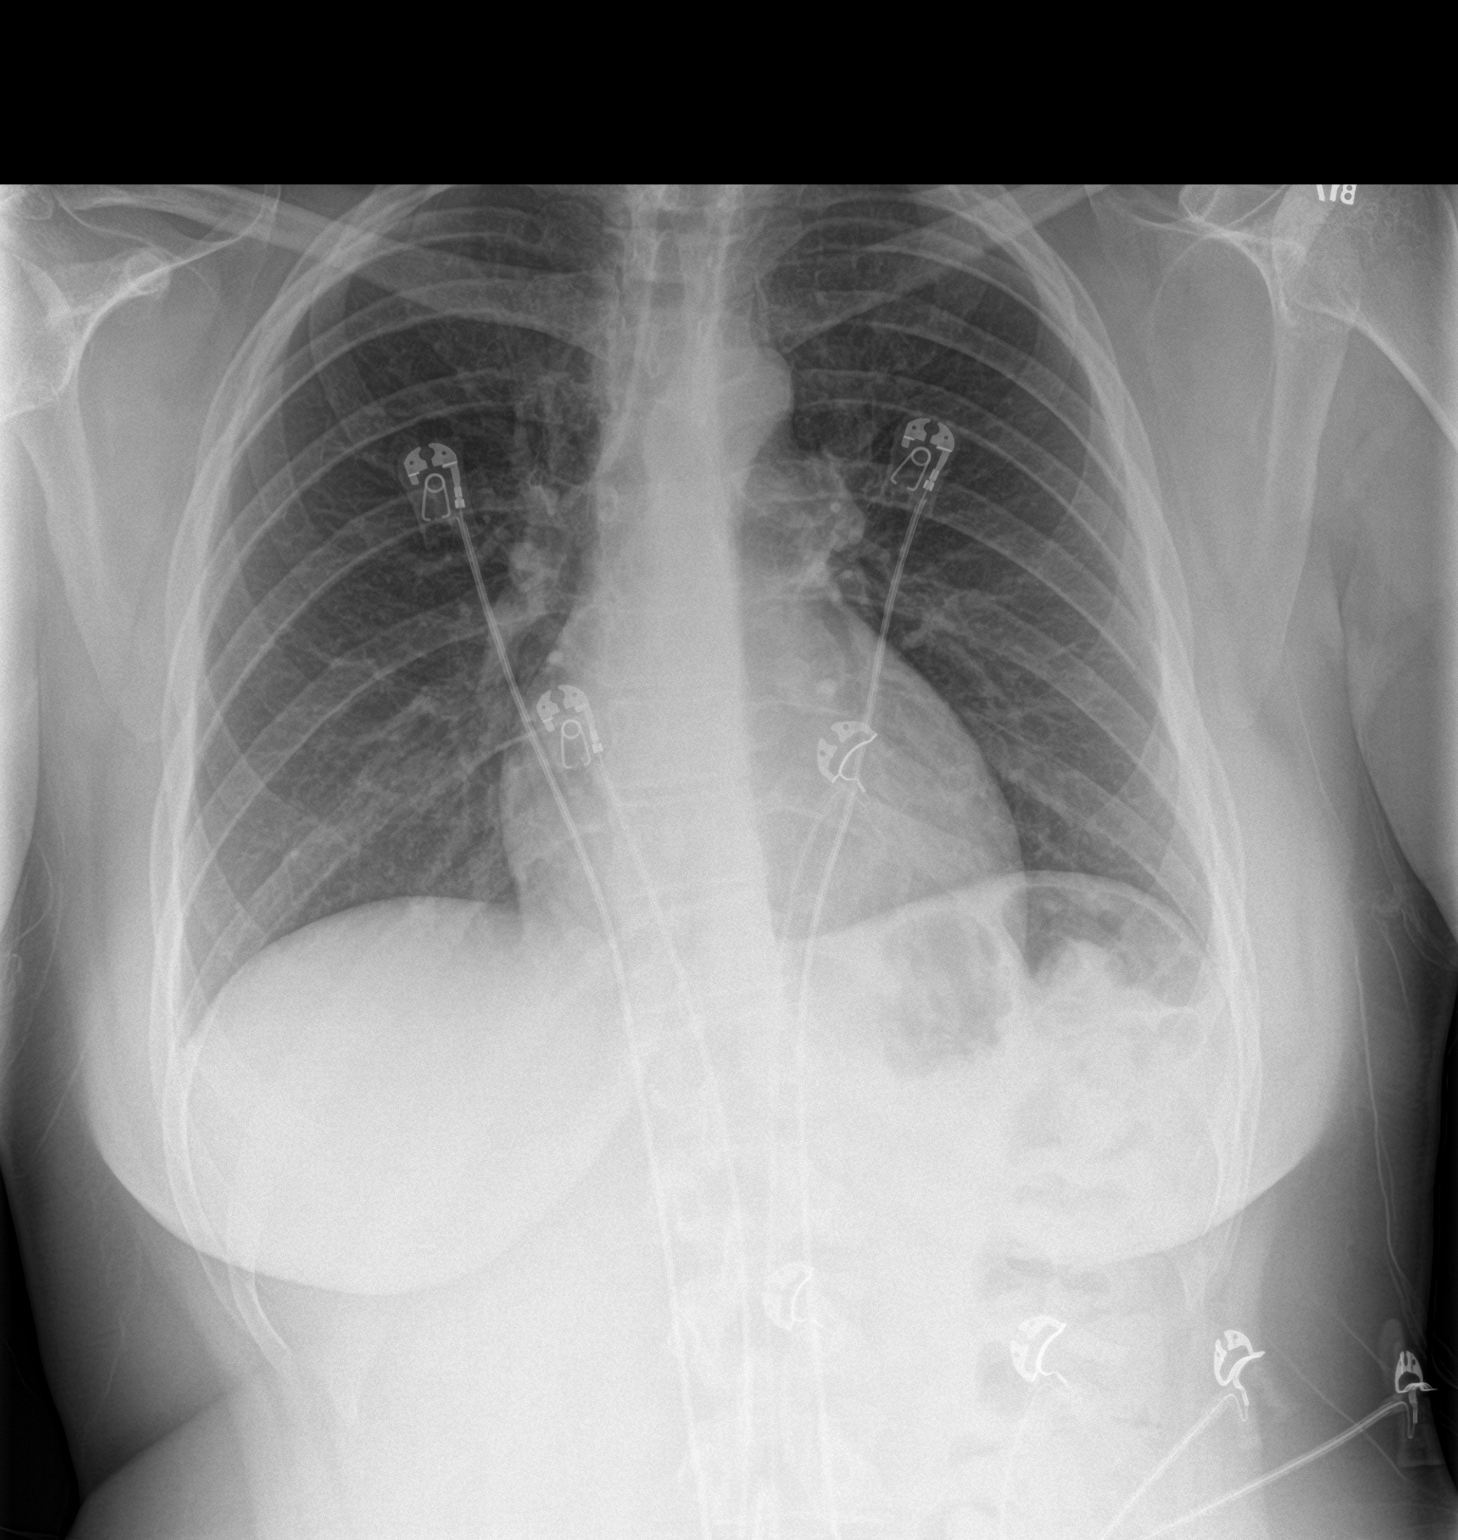

[chest lat]
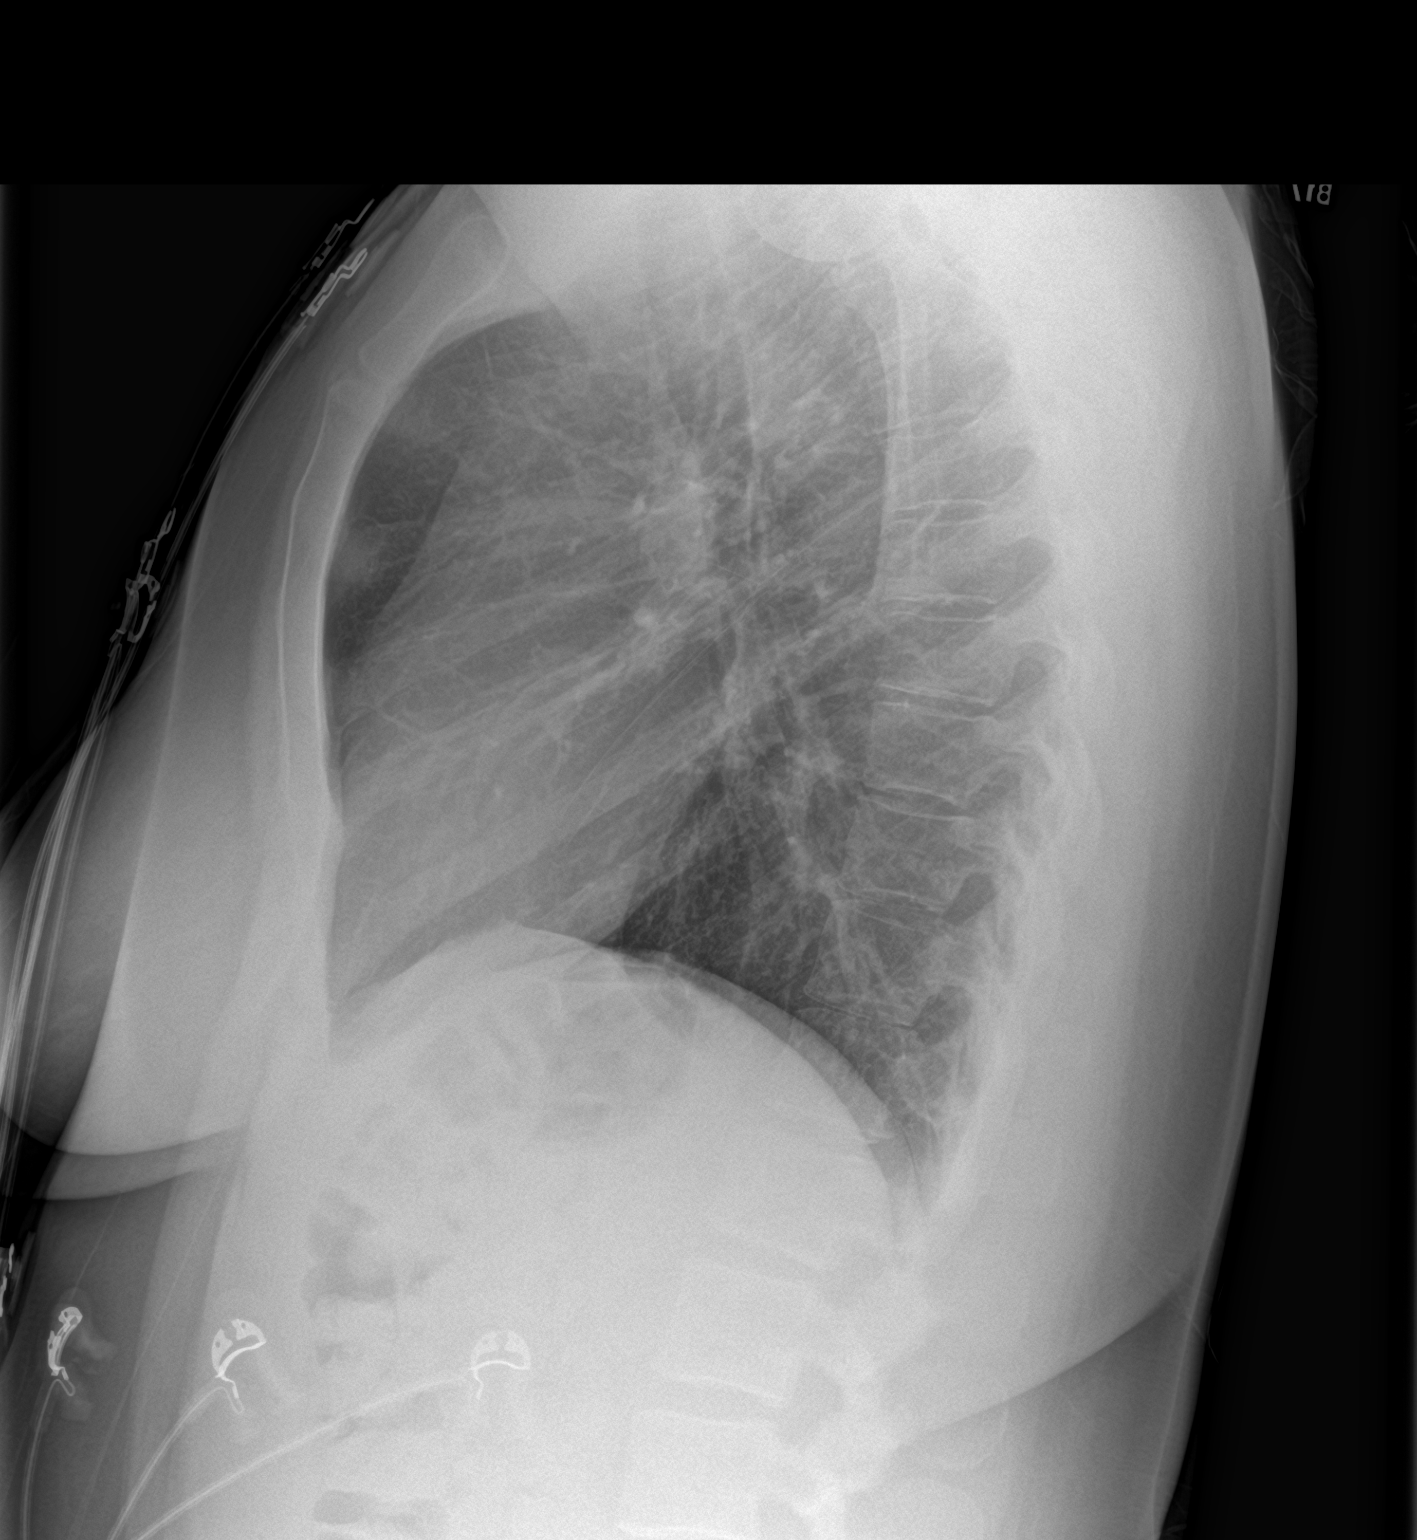

[2 of 2 positions shown; findings below may reference images not displayed]

FINDINGS: Lungs are clear. Heart size and pulmonary vascularity are normal. No
adenopathy. No pneumothorax. There is thoracolumbar levoscoliosis.
IMPRESSION: No edema or consolidation.

## 2021-10-07 NOTE — L&D Delivery Note (Cosign Needed Addendum)
[facility]N Faculty Practice Delivery Note  Anita Herrera is a 26 y.o. [id number] s/p SVD at [redacted]w[redacted]d. She was admitted for IOL for abnormal BPP, maternal obesity.   ROM: 6h 81m with clear fluid GBS Status: positive- adequately treated w/ penicillin  Maximum Maternal Temperature: 98.4  Labor Progress: Progressed normally following ripening with FB and cytotec. Reactive NST during induction phase. Augmented with low dose pitocin for 1 hour prior to delivery.   Delivery Date/Time: 08/07/22, 0405 Delivery: Were called to bedside for prolonged deceleration, patient was completely dilated and at +1 station. Good fetal descent with each push. Delivered LOA after 4 pushes. No nuchal cord present. Shoulder and body delivered in usual fashion. Infant with spontaneous cry, placed on mother's abdomen, dried and stimulated. Cord clamped x 2 after 1-minute delay, and cut by FOB under my direct supervision. Cord blood drawn. Placenta delivered spontaneously with gentle cord traction. Fundus firm with massage and Pitocin. Labia, perineum, vagina, and cervix were inspected, small hemostatic labial abrasions noted.   Placenta: complete, three vessel cord appreciated Complications: none Lacerations: none EBL: 33 mL Analgesia: epidural  Infant: female  APGARs 8/9  weight pending  Cecilio Asper, MD [facility] [place] [place]   Fellow ATTESTATION  I was present and gloved for this delivery and agree with the above documentation in the resident's note except as below.  Concepcion Living, MD [facility] (Faculty Practice) 08/07/2022, 6:43 AM

## 2022-02-07 ENCOUNTER — Ambulatory Visit (INDEPENDENT_AMBULATORY_CARE_PROVIDER_SITE_OTHER): Payer: Medicaid Other | Admitting: Emergency Medicine

## 2022-02-07 DIAGNOSIS — Z3A00 Weeks of gestation of pregnancy not specified: Secondary | ICD-10-CM

## 2022-02-07 DIAGNOSIS — O0940 Supervision of pregnancy with grand multiparity, unspecified trimester: Secondary | ICD-10-CM

## 2022-02-07 DIAGNOSIS — Z3480 Encounter for supervision of other normal pregnancy, unspecified trimester: Secondary | ICD-10-CM | POA: Insufficient documentation

## 2022-02-07 MED ORDER — BLOOD PRESSURE KIT DEVI: 1.0000 | 0 refills | Status: DC

## 2022-02-07 MED ORDER — PRENATAL 28-0.8 MG PO TABS: 1.0000 | ORAL_TABLET | Freq: Every day | ORAL | 11 refills | Status: DC

## 2022-02-07 NOTE — Progress Notes (Signed)
New OB Intake ? ?I connected with  Anita Herrera on 02/07/22 at  9:00 AM EDT by telephone Video Visit and verified that I am speaking with the correct person using two identifiers. Nurse is located at [facility] and pt is located at home. ? ?I discussed the limitations, risks, security and privacy concerns of performing an evaluation and management service by telephone and the availability of in person appointments. I also discussed with the patient that there may be a patient responsible charge related to this service. The patient expressed understanding and agreed to proceed. ? ?I explained I am completing New OB Intake today. We discussed her EDD of 08/07/22 that is based on LMP of 10/31/21. Pt is [id number]. I reviewed her allergies, medications, Medical/Surgical/OB history, and appropriate screenings. I informed her of [facility] services. Based on history, this is a/an  pregnancy uncomplicated .  ? ?Patient Active Problem List  ? Diagnosis Date Noted  ? Active labor at term 10/07/2015  ? Active labor 04/19/2014  ? Postpartum state 04/19/2014  ? ? ?Concerns addressed today ? ?Delivery Plans:  ?Plans to deliver at [facility].  ? ?MyChart/Babyscripts ?MyChart access verified. I explained pt will have some visits in office and some virtually. Babyscripts instructions given and order placed. Patient verifies receipt of registration text/e-mail. Account successfully created and app downloaded. ? ?Blood Pressure Cuff  ?Blood pressure cuff ordered for patient to pick-up from [employer]. Explained after first prenatal appt pt will check weekly and document in 36. ? ?Weight scale: Patient does have weight scale. Weight scale ordered for patient to pick up from [employer].  ? ?Anatomy US ?Explained first scheduled [place] will be around 19 weeks.  ? ?Labs ?Discussed Johnsie Cancel genetic screening with patient. Would like both Panorama and Horizon drawn at new OB visit.Also if interested in genetic testing, tell patient she  will need AFP 15-21 weeks to complete genetic testing .Routine prenatal labs needed. ? ?Covid Vaccine ?Patient has not covid vaccine.  ? ? ? ?Social Determinants of Health ?Food Insecurity: Patient denies food insecurity. ?WIC Referral: Patient is interested in referral to [facility].  ?Transportation: Patient denies transportation needs. ?Childcare: Discussed no children allowed at ultrasound appointments. Offered childcare services; patient declines childcare services at this time. ? ?Send link to Pregnancy Navigators ? ? ?Placed OB Box on problem list and updated ? ?First visit review ?I reviewed new OB appt with pt. I explained she will have a pelvic exam, ob bloodwork with genetic screening, and PAP smear. Explained pt will be seen by Arlina Robes, MD at first visit; encounter routed to appropriate provider. Explained that patient will be seen by pregnancy navigator following visit with provider. [facility] information placed in AVS.  ? ?Barkley Boards, RN ?02/07/2022  9:02 AM  ?

## 2022-02-13 ENCOUNTER — Encounter: Payer: Medicaid Other | Admitting: Obstetrics & Gynecology

## 2022-02-21 ENCOUNTER — Encounter: Payer: Self-pay | Admitting: Obstetrics and Gynecology

## 2022-02-21 ENCOUNTER — Other Ambulatory Visit (HOSPITAL_COMMUNITY)
Admission: RE | Admit: 2022-02-21 | Discharge: 2022-02-21 | Disposition: A | Discharge status: Home / Self Care | Payer: Medicaid Other | Source: Ambulatory Visit | Attending: Obstetrics & Gynecology | Admitting: Obstetrics & Gynecology

## 2022-02-21 ENCOUNTER — Ambulatory Visit (INDEPENDENT_AMBULATORY_CARE_PROVIDER_SITE_OTHER): Payer: Medicaid Other | Admitting: Obstetrics and Gynecology

## 2022-02-21 VITALS — BP 127/84 | HR 103 | Wt 275.0 lb

## 2022-02-21 DIAGNOSIS — Z3480 Encounter for supervision of other normal pregnancy, unspecified trimester: Secondary | ICD-10-CM | POA: Diagnosis present

## 2022-02-21 DIAGNOSIS — Z3A16 16 weeks gestation of pregnancy: Secondary | ICD-10-CM

## 2022-02-21 NOTE — Patient Instructions (Signed)

## 2022-02-21 NOTE — Progress Notes (Signed)
New OB, declined Genetic screening. C/o HA 5/10

## 2022-02-21 NOTE — Progress Notes (Signed)
Subjective:  Anita Herrera is a 26 y.o. F7P1025 at [redacted]w[redacted]d being seen today for her first OB visit. H/O TSVD x 2 without problems.  ongoing prenatal care.  She is currently monitored for the following issues for this low-risk pregnancy and has Active labor; Active labor at term; and Supervision of other normal pregnancy, antepartum on their problem list.  Patient reports no complaints.  Contractions: Not present. Vag. Bleeding: None.   . Denies leaking of fluid.   The following portions of the patient's history were reviewed and updated as appropriate: allergies, current medications, past family history, past medical history, past social history, past surgical history and problem list. Problem list updated.  Objective:   Vitals:   02/21/22 1440 02/21/22 1451  BP: (!) 141/82 127/84  Pulse: 83 (!) 103  Weight: 275 lb (124.7 kg) 275 lb (124.7 kg)    Fetal Status: Fetal Heart Rate (bpm): 155         General:  Alert, oriented and cooperative. Patient is in no acute distress.  Skin: Skin is warm and dry. No rash noted.   Cardiovascular: Normal heart rate noted  Respiratory: Normal respiratory effort, no problems with respiration noted  Abdomen: Soft, gravid, appropriate for gestational age. Pain/Pressure: Absent     Pelvic:  Cervical exam performed        Extremities: Normal range of motion.  Edema: None  Mental Status: Normal mood and affect. Normal behavior. Normal judgment and thought content.   Urinalysis:      Assessment and Plan:  Pregnancy: [id number] at [redacted]w[redacted]d  1. Supervision of other normal pregnancy, antepartum Prenatal care and labs reviewed with pt Genetic testing reviewed - AFP, Serum, Open Spina Bifida - Cytology - PAP( [place]) - Cervicovaginal ancillary only( [place]) - Culture, OB Urine - CBC/D/Plt+RPR+Rh+ABO+RubIgG... - HgB A1c - [place] MFM OB DETAIL +14 WK; Future  Preterm labor symptoms and general obstetric precautions including but not limited to vaginal  bleeding, contractions, leaking of fluid and fetal movement were reviewed in detail with the patient. Please refer to After Visit Summary for other counseling recommendations.  Return in about 4 weeks (around 03/21/2022) for OB visit, face to face, any provider.   Hermina Staggers, MD

## 2022-02-22 LAB — CERVICOVAGINAL ANCILLARY ONLY
Bacterial Vaginitis (gardnerella): NEGATIVE
Candida Glabrata: NEGATIVE
Candida Vaginitis: NEGATIVE
Chlamydia: NEGATIVE
Comment: NEGATIVE
Comment: NEGATIVE
Comment: NEGATIVE
Comment: NEGATIVE
Comment: NEGATIVE
Comment: NORMAL
Neisseria Gonorrhea: NEGATIVE
Trichomonas: NEGATIVE

## 2022-02-23 LAB — AFP, SERUM, OPEN SPINA BIFIDA
AFP MoM: 0.92
AFP Value: 23.3 ng/mL
Gest. Age on Collection Date: 16.1 weeks
Maternal Age At EDD: 27.8 yr
OSBR Risk 1 IN: 10000
Test Results:: NEGATIVE
Weight: 257 [lb_av]

## 2022-02-23 LAB — HEMOGLOBIN A1C
Est. average glucose Bld gHb Est-mCnc: 100 mg/dL
Hgb A1c MFr Bld: 5.1 % (ref 4.8–5.6)

## 2022-02-23 LAB — CBC/D/PLT+RPR+RH+ABO+RUBIGG...
Antibody Screen: NEGATIVE
Basophils Absolute: 0 10*3/uL (ref 0.0–0.2)
Basos: 0 %
EOS (ABSOLUTE): 0.1 10*3/uL (ref 0.0–0.4)
Eos: 1 %
HCV Ab: NONREACTIVE
HIV Screen 4th Generation wRfx: NONREACTIVE
Hematocrit: 33.1 % — ABNORMAL LOW (ref 34.0–46.6)
Hemoglobin: 11.5 g/dL (ref 11.1–15.9)
Hepatitis B Surface Ag: NEGATIVE
Immature Grans (Abs): 0 10*3/uL (ref 0.0–0.1)
Immature Granulocytes: 0 %
Lymphocytes Absolute: 1.3 10*3/uL (ref 0.7–3.1)
Lymphs: 16 %
MCH: 33.4 pg — ABNORMAL HIGH (ref 26.6–33.0)
MCHC: 34.7 g/dL (ref 31.5–35.7)
MCV: 96 fL (ref 79–97)
Monocytes Absolute: 0.5 10*3/uL (ref 0.1–0.9)
Monocytes: 6 %
Neutrophils Absolute: 6.2 10*3/uL (ref 1.4–7.0)
Neutrophils: 77 %
Platelets: 220 10*3/uL (ref 150–450)
RBC: 3.44 x10E6/uL — ABNORMAL LOW (ref 3.77–5.28)
RDW: 11.9 % (ref 11.7–15.4)
RPR Ser Ql: NONREACTIVE
Rh Factor: POSITIVE
Rubella Antibodies, IGG: 1.01 index (ref >0.99)
WBC: 8 10*3/uL (ref 3.4–10.8)

## 2022-02-23 LAB — HCV INTERPRETATION

## 2022-02-24 LAB — CULTURE, OB URINE

## 2022-02-24 LAB — URINE CULTURE, OB REFLEX

## 2022-02-25 LAB — CYTOLOGY - PAP
Comment: NEGATIVE
Diagnosis: UNDETERMINED — AB
High risk HPV: NEGATIVE

## 2022-02-26 ENCOUNTER — Encounter: Payer: Self-pay | Admitting: Obstetrics and Gynecology

## 2022-02-26 DIAGNOSIS — R87612 Low grade squamous intraepithelial lesion on cytologic smear of cervix (LGSIL): Secondary | ICD-10-CM | POA: Insufficient documentation

## 2022-02-26 DIAGNOSIS — O2340 Unspecified infection of urinary tract in pregnancy, unspecified trimester: Secondary | ICD-10-CM | POA: Insufficient documentation

## 2022-02-27 ENCOUNTER — Other Ambulatory Visit: Payer: Self-pay

## 2022-02-27 DIAGNOSIS — N390 Urinary tract infection, site not specified: Secondary | ICD-10-CM

## 2022-02-27 MED ORDER — AMOXICILLIN-POT CLAVULANATE 875-125 MG PO TABS: 1.0000 | ORAL_TABLET | Freq: Two times a day (BID) | ORAL | 1 refills | Status: DC

## 2022-03-21 ENCOUNTER — Encounter: Payer: Self-pay | Admitting: Obstetrics

## 2022-03-21 ENCOUNTER — Ambulatory Visit (INDEPENDENT_AMBULATORY_CARE_PROVIDER_SITE_OTHER): Payer: Medicaid Other | Admitting: Obstetrics

## 2022-03-21 VITALS — BP 120/79 | HR 77 | Wt 275.3 lb

## 2022-03-21 DIAGNOSIS — R87612 Low grade squamous intraepithelial lesion on cytologic smear of cervix (LGSIL): Secondary | ICD-10-CM

## 2022-03-21 DIAGNOSIS — Z3480 Encounter for supervision of other normal pregnancy, unspecified trimester: Secondary | ICD-10-CM

## 2022-03-21 DIAGNOSIS — B3731 Acute candidiasis of vulva and vagina: Secondary | ICD-10-CM

## 2022-03-21 DIAGNOSIS — M549 Dorsalgia, unspecified: Secondary | ICD-10-CM

## 2022-03-21 MED ORDER — COMFORT FIT MATERNITY SUPP SM MISC: 0 refills | Status: DC

## 2022-03-21 MED ORDER — TERCONAZOLE 0.8 % VA CREA: 1.0000 | TOPICAL_CREAM | Freq: Every day | VAGINAL | 0 refills | Status: DC

## 2022-03-21 NOTE — Progress Notes (Signed)
Pt presents for ROB. Reports white/yellow discharge with vaginal itching x1 week, after finishing abx for UTI.

## 2022-03-21 NOTE — Progress Notes (Signed)
Subjective:  Anita Herrera is a 26 y.o. G7P204 2 at [redacted]w[redacted]d being seen today for ongoing prenatal care.  She is currently monitored for the following issues for this low-risk pregnancy and has Supervision of other normal pregnancy, antepartum; LGSIL of cervix of undetermined significance; and UTI (urinary tract infection) during pregnancy on their problem list.  Patient reports no complaints.   .  .   . Denies leaking of fluid.   The following portions of the patient's history were reviewed and updated as appropriate: allergies, current medications, past family history, past medical history, past social history, past surgical history and problem list. Problem list updated.  Objective:  There were no vitals filed for this visit.  Fetal Status:           General:  Alert, oriented and cooperative. Patient is in no acute distress.  Skin: Skin is warm and dry. No rash noted.   Cardiovascular: Normal heart rate noted  Respiratory: Normal respiratory effort, no problems with respiration noted  Abdomen: Soft, gravid, appropriate for gestational age.       Pelvic:  Cervical exam deferred        Extremities: Normal range of motion.     Mental Status: Normal mood and affect. Normal behavior. Normal judgment and thought content.   Urinalysis:      Assessment and Plan:  Pregnancy: [id number] at [redacted]w[redacted]d  1. Supervision of other normal pregnancy, antepartum  2. Backache symptom Rx: - Elastic Bandages & Supports (COMFORT FIT MATERNITY SUPP SM) MISC; Wear as directed.  Dispense: 1 each; Refill: 0  3. Low grade squamous intraepith lesion on cytologic smear cervix (lgsil) - colposcopy 3-4 months postpartum  4. Candida vaginitis Rx: - terconazole (TERAZOL 3) 0.8 % vaginal cream; Place 1 applicator vaginally at bedtime. Apply nightly for three nights.  Dispense: 20 g; Refill: 0    Preterm labor symptoms and general obstetric precautions including but not limited to vaginal bleeding, contractions,  leaking of fluid and fetal movement were reviewed in detail with the patient. Please refer to After Visit Summary for other counseling recommendations.   Return in about 4 weeks (around 04/18/2022) for ROB.   Brock Bad, MD  03/21/22

## 2022-03-22 ENCOUNTER — Ambulatory Visit: Payer: Medicaid Other | Attending: Obstetrics and Gynecology

## 2022-03-22 ENCOUNTER — Ambulatory Visit: Payer: Medicaid Other | Admitting: *Deleted

## 2022-03-22 ENCOUNTER — Other Ambulatory Visit: Payer: Self-pay | Admitting: *Deleted

## 2022-03-22 ENCOUNTER — Encounter: Payer: Self-pay | Admitting: *Deleted

## 2022-03-22 VITALS — BP 131/63 | HR 78

## 2022-03-22 DIAGNOSIS — Z3480 Encounter for supervision of other normal pregnancy, unspecified trimester: Secondary | ICD-10-CM

## 2022-03-22 DIAGNOSIS — Z6841 Body Mass Index (BMI) 40.0 and over, adult: Secondary | ICD-10-CM

## 2022-03-22 DIAGNOSIS — O99212 Obesity complicating pregnancy, second trimester: Secondary | ICD-10-CM | POA: Diagnosis not present

## 2022-03-22 DIAGNOSIS — Z362 Encounter for other antenatal screening follow-up: Secondary | ICD-10-CM

## 2022-03-22 DIAGNOSIS — Z3A19 19 weeks gestation of pregnancy: Secondary | ICD-10-CM | POA: Diagnosis not present

## 2022-04-22 ENCOUNTER — Ambulatory Visit: Payer: Medicaid Other | Attending: Maternal & Fetal Medicine

## 2022-04-22 ENCOUNTER — Ambulatory Visit: Payer: Medicaid Other | Admitting: *Deleted

## 2022-04-22 ENCOUNTER — Other Ambulatory Visit (HOSPITAL_COMMUNITY)
Admission: RE | Admit: 2022-04-22 | Discharge: 2022-04-22 | Disposition: A | Discharge status: Home / Self Care | Payer: Medicaid Other | Source: Ambulatory Visit | Attending: Obstetrics & Gynecology | Admitting: Obstetrics & Gynecology

## 2022-04-22 ENCOUNTER — Encounter: Payer: Self-pay | Admitting: *Deleted

## 2022-04-22 ENCOUNTER — Other Ambulatory Visit: Payer: Self-pay | Admitting: *Deleted

## 2022-04-22 ENCOUNTER — Ambulatory Visit (INDEPENDENT_AMBULATORY_CARE_PROVIDER_SITE_OTHER): Payer: Medicaid Other | Admitting: Obstetrics & Gynecology

## 2022-04-22 VITALS — BP 118/74 | HR 79 | Wt 278.0 lb

## 2022-04-22 VITALS — BP 128/45 | HR 117

## 2022-04-22 DIAGNOSIS — O23599 Infection of other part of genital tract in pregnancy, unspecified trimester: Secondary | ICD-10-CM | POA: Diagnosis present

## 2022-04-22 DIAGNOSIS — Z3A23 23 weeks gestation of pregnancy: Secondary | ICD-10-CM | POA: Diagnosis not present

## 2022-04-22 DIAGNOSIS — Z3482 Encounter for supervision of other normal pregnancy, second trimester: Secondary | ICD-10-CM

## 2022-04-22 DIAGNOSIS — O99212 Obesity complicating pregnancy, second trimester: Secondary | ICD-10-CM | POA: Diagnosis not present

## 2022-04-22 DIAGNOSIS — Z6841 Body Mass Index (BMI) 40.0 and over, adult: Secondary | ICD-10-CM

## 2022-04-22 DIAGNOSIS — E669 Obesity, unspecified: Secondary | ICD-10-CM

## 2022-04-22 DIAGNOSIS — O2342 Unspecified infection of urinary tract in pregnancy, second trimester: Secondary | ICD-10-CM

## 2022-04-22 DIAGNOSIS — Z3480 Encounter for supervision of other normal pregnancy, unspecified trimester: Secondary | ICD-10-CM

## 2022-04-22 DIAGNOSIS — O358XX0 Maternal care for other (suspected) fetal abnormality and damage, not applicable or unspecified: Secondary | ICD-10-CM

## 2022-04-22 DIAGNOSIS — O23592 Infection of other part of genital tract in pregnancy, second trimester: Secondary | ICD-10-CM

## 2022-04-22 DIAGNOSIS — N760 Acute vaginitis: Secondary | ICD-10-CM | POA: Diagnosis present

## 2022-04-22 DIAGNOSIS — Z362 Encounter for other antenatal screening follow-up: Secondary | ICD-10-CM | POA: Insufficient documentation

## 2022-04-22 LAB — POCT URINALYSIS DIPSTICK
Bilirubin, UA: NEGATIVE
Glucose, UA: NEGATIVE
Ketones, UA: NEGATIVE
Nitrite, UA: POSITIVE
Protein, UA: NEGATIVE
Spec Grav, UA: <=1.005 — AB (ref 1.010–1.025)
Urobilinogen, UA: 0.2 E.U./dL
pH, UA: 6.5 (ref 5.0–8.0)

## 2022-04-22 MED ORDER — TERCONAZOLE 0.8 % VA CREA: 1.0000 | TOPICAL_CREAM | Freq: Every day | VAGINAL | 3 refills | Status: DC

## 2022-04-22 MED ORDER — CEFADROXIL 500 MG PO CAPS: 500.0000 mg | ORAL_CAPSULE | Freq: Two times a day (BID) | ORAL | 0 refills | Status: DC

## 2022-04-22 NOTE — Patient Instructions (Signed)
Return to office for any scheduled appointments. Call the office or go to the MAU at [facility] [place] if: You begin to have strong, frequent contractions Your water breaks.  Sometimes it is a big gush of fluid, sometimes it is just a trickle that keeps getting your underwear wet or running down your legs You have vaginal bleeding.  It is normal to have a small amount of spotting if your cervix was checked.  You do not feel your baby moving like normal.  If you do not, get something to eat and drink and lay down and focus on feeling your baby move.   If your baby is still not moving like normal, you should call the office or go to MAU. Any other obstetric concerns.   TDaP Vaccine Pregnancy Get the Whooping Cough Vaccine While You Are Pregnant (CDC)  It is important for women to get the whooping cough vaccine in the third trimester of each pregnancy. Vaccines are the best way to prevent this disease. There are 2 different whooping cough vaccines. Both vaccines combine protection against whooping cough, tetanus and diphtheria, but they are for different age groups: Tdap: for everyone 11 years or older, including pregnant women  DTaP: for children 2 months through 6 years of age  You need the whooping cough vaccine during each of your pregnancies The recommended time to get the shot is during your 27th through 36th week of pregnancy, preferably during the earlier part of this time period. The Centers for Disease Control and Prevention (CDC) recommends that pregnant women receive the whooping cough vaccine for adolescents and adults (called Tdap vaccine) during the third trimester of each pregnancy. The recommended time to get the shot is during your 27th through 36th week of pregnancy, preferably during the earlier part of this time period. This replaces the original recommendation that pregnant women get the vaccine only if they had not previously received it. The American  College of Obstetricians and Gynecologists and the American College of Nurse-Midwives support this recommendation.  You should get the whooping cough vaccine while pregnant to pass protection to your baby frame support disabled and/or not supported in this browser  Learn why Anita Herrera decided to get the whooping cough vaccine in her 3rd trimester of pregnancy and how her baby girl was born with some protection against the disease. Also available on YouTube. After receiving the whooping cough vaccine, your body will create protective antibodies (proteins produced by the body to fight off diseases) and pass some of them to your baby before birth. These antibodies provide your baby some short-term protection against whooping cough in early life. These antibodies can also protect your baby from some of the more serious complications that come along with whooping cough. Your protective antibodies are at their highest about 2 weeks after getting the vaccine, but it takes time to pass them to your baby. So the preferred time to get the whooping cough vaccine is early in your third trimester. The amount of whooping cough antibodies in your body decreases over time. That is why CDC recommends you get a whooping cough vaccine during each pregnancy. Doing so allows each of your babies to get the greatest number of protective antibodies from you. This means each of your babies will get the best protection possible against this disease.  Getting the whooping cough vaccine while pregnant is better than getting the vaccine after you give birth Whooping cough vaccination during pregnancy is ideal so   your baby will have short-term protection as soon as he is born. This early protection is important because your baby will not start getting his whooping cough vaccines until he is 2 months old. These first few months of life are when your baby is at greatest risk for catching whooping cough. This is also when he's at greatest  risk for having severe, potentially life-threating complications from the infection. To avoid that gap in protection, it is best to get a whooping cough vaccine during pregnancy. You will then pass protection to your baby before he is born. To continue protecting your baby, he should get whooping cough vaccines starting at 2 months old. You may never have gotten the Tdap vaccine before and did not get it during this pregnancy. If so, you should make sure to get the vaccine immediately after you give birth, before leaving the hospital or birthing center. It will take about 2 weeks before your body develops protection (antibodies) in response to the vaccine. Once you have protection from the vaccine, you are less likely to give whooping cough to your newborn while caring for him. But remember, your baby will still be at risk for catching whooping cough from others. A recent study looked to see how effective Tdap was at preventing whooping cough in babies whose mothers got the vaccine while pregnant or in the hospital after giving birth. The study found that getting Tdap between 27 through 36 weeks of pregnancy is 85% more effective at preventing whooping cough in babies younger than 2 months old. Blood tests cannot tell if you need a whooping cough vaccine There are no blood tests that can tell you if you have enough antibodies in your body to protect yourself or your baby against whooping cough. Even if you have been sick with whooping cough in the past or previously received the vaccine, you still should get the vaccine during each pregnancy. Breastfeeding may pass some protective antibodies onto your baby By breastfeeding, you may pass some antibodies you have made in response to the vaccine to your baby. When you get a whooping cough vaccine during your pregnancy, you will have antibodies in your breast milk that you can share with your baby as soon as your milk comes in. However, your baby will not get  protective antibodies immediately if you wait to get the whooping cough vaccine until after delivering your baby. This is because it takes about 2 weeks for your body to create antibodies. Learn more about the health benefits of breastfeeding.  

## 2022-04-22 NOTE — Progress Notes (Signed)
PRENATAL VISIT NOTE  Subjective:  Anita Herrera is a 26 y.o. [id number] at [redacted]w[redacted]d being seen today for ongoing prenatal care.  She is currently monitored for the following issues for this low-risk pregnancy and has Supervision of other normal pregnancy, antepartum; LGSIL of cervix of undetermined significance; and UTI (urinary tract infection) during pregnancy on their problem list.  Patient reports vaginal irritation and dysuria.  Feels she has another UTI and another yeast infection .  Contractions: Not present. Vag. Bleeding: None.  Movement: Present. Denies leaking of fluid.   The following portions of the patient's history were reviewed and updated as appropriate: allergies, current medications, past family history, past medical history, past social history, past surgical history and problem list.   Objective:   Vitals:   04/22/22 0934  BP: 118/74  Pulse: 79  Weight: 278 lb (126.1 kg)    Fetal Status: Fetal Heart Rate (bpm): 140   Movement: Present     General:  Alert, oriented and cooperative. Patient is in no acute distress.  Skin: Skin is warm and dry. No rash noted.   Cardiovascular: Normal heart rate noted  Respiratory: Normal respiratory effort, no problems with respiration noted  Abdomen: Soft, gravid, appropriate for gestational age.  Pain/Pressure: Absent     Pelvic: Cervical exam deferred        Extremities: Normal range of motion.     Mental Status: Normal mood and affect. Normal behavior. Normal judgment and thought content.   Imaging [place] MFM OB DETAIL +14 WK  Result Date: 03/22/2022 ----------------------------------------------------------------------  OBSTETRICS REPORT                       (Signed Final 03/22/2022 02:58 pm) ---------------------------------------------------------------------- Patient Info  ID #:       [id number]                          D.O.B.:  August 10, 1996 (25 yrs)  Name:       Anita Herrera               Visit Date: 03/22/2022 01:21 pm  ---------------------------------------------------------------------- Performed By  Attending:        Lin Landsman      Ref. Address:     [address]                    MD                                                             [address]                                                             [place], [place]  [id number]  Performed By:     Burt KnackJennifer Ives RDMS     [facility]  Referred By:      Hermina StaggersMICHAEL L ERVIN                    MD ---------------------------------------------------------------------- Orders  #  Description                           Code        Ordered By  1  US MFM OB DETAIL +14 WK               [id number]    Nettie ElmMICHAEL ERVIN ----------------------------------------------------------------------  #  Order #                     Accession #                Episode #  1  [id number]                   [id number]                 [id number] ---------------------------------------------------------------------- Indications  [redacted] weeks gestation of pregnancy                Z3A.19  Obesity complicating pregnancy, second         O99.212  trimester (BMI 42)  Encounter for antenatal screening for          Z36.3  malformations  AFP negative ---------------------------------------------------------------------- Vital Signs  BP:          131/63 ---------------------------------------------------------------------- Fetal Evaluation  Num Of Fetuses:         1  Fetal Heart Rate(bpm):  148  Cardiac Activity:       Observed  Presentation:           Breech  Placenta:               Anterior  P. Cord Insertion:      Visualized  Amniotic Fluid  AFI FV:      Within normal limits                              Largest Pocket(cm)  3.68 ---------------------------------------------------------------------- Biometry  BPD:     42.82  mm     G. Age:  19w 0d         49  %    CI:        73.02   %    70 - 86                                                          FL/HC:      18.6   %    16.1 - 18.3  HC:    159.29   mm     G. Age:  18w 5d         31  %    HC/AC:      1.18        1.09 - 1.39  AC:    135.18   mm     G. Age:  19w 0d         45  %    FL/BPD:     69.2   %  FL:      29.65  mm     G. Age:  19w 1d         49  %    FL/AC:      21.9   %    20 - 24  HUM:      29.7  mm     G. Age:  19w 5d         69  %  CER:      18.9  mm     G. Age:  18w 4d         15  %  NFT:       3.7  mm  LV:        5.6  mm  CM:        6.7  mm  IOD:      11.6  mm     G. Age:  18w 0d         59  %  OOD:      28.8  mm     G. Age:  18w 1d         36  %  Est. FW:     270  gm    0 lb 10 oz      47  % ---------------------------------------------------------------------- OB History  Gravidity:    7         Term:   2         SAB:   1  TOP:          3        Living:  2 ---------------------------------------------------------------------- Gestational Age  LMP:           20w 2d        Date:  10/31/21                  EDD:   08/07/22  U/S Today:     19w 0d                                        EDD:   08/16/22  Best:          19w 0d     Det. By:  U/S (03/22/22)           EDD:   08/16/22 ---------------------------------------------------------------------- Anatomy  Cranium:               Appears normal         Aortic Arch:            Appears normal  Cavum:                 Appears normal         Ductal Arch:            Not well visualized  Ventricles:            Appears normal         Diaphragm:              Appears normal  Choroid Plexus:        Appears normal         Stomach:                Appears normal, left                                                                        sided  Cerebellum:            Appears normal         Abdomen:                Appears  normal  Posterior Fossa:       Appears normal         Abdominal Wall:         Appears nml (cord                                                                        insert, abd wall)  Nuchal Fold:           Appears normal         Cord Vessels:           Appears normal (3                                                                        vessel cord)  Face:                  Orbits appear          Kidneys:                Appear normal  normal  Lips:                  Appears normal         Bladder:                Appears normal  Thoracic:              Appears normal         Spine:                  Appears normal  Heart:                 Not well visualized    Upper Extremities:      Appears normal  RVOT:                  Not well visualized    Lower Extremities:      Appears normal  LVOT:                  Appears normal  Other:  Lenses, maxilla, mandible and falx visualized Heels/feet and open          hands/5th digits visualized. VC and 3VV visualized. Fetus appears to          be female. ---------------------------------------------------------------------- Targeted Anatomy  Head/Neck  Nasal Bone:            Not well visualized    Orbits/Eyes:            Appears normal  Palate:                Not well visualized    Mandible:               Appears normal  Profile:               Not well visualized    Maxilla:                Appears normal  Thorax  Aortic Arch:           Appears normal         3 V Trachea View:       Not well visualized  SVC:                   Appears normal         IVC:                    Appears normal  3 Vessel View:         Appears normal ---------------------------------------------------------------------- Cervix Uterus Adnexa  Cervix  Length:            4.1  cm.  Normal appearance by transabdominal scan.  Uterus  No abnormality visualized.  Right Ovary  Size(cm)     5.51   x   3.27   x  2.78      Vol(ml): 26.23  Within normal limits.  Left Ovary  Size(cm)     3.05   x    2.49   x  1.94      Vol(ml): 7.71  Within normal limits.  Cul De Sac  No free fluid seen.  Adnexa  No abnormality visualized. ---------------------------------------------------------------------- Impression  Single intrauterine pregnancy here for a detailed anatomy  secondary to elevate BMI  Normal anatomy with measurements consistent with today's  ultrasound. We are changing her dates as she is currently  dated by LMP but  has irregular periods. All biometry is  consistent with 19 weeks. Giving her a new EDD of 08/16/22.  There is good fetal movement and amniotic fluid volume  Suboptimal views of the fetal anatomy were obtained  secondary to fetal position. ---------------------------------------------------------------------- Recommendations  Follow up growth and anatomy in 4 weeks and to confirm  dates. ----------------------------------------------------------------------              Lin Landsman, MD Electronically Signed Final Report   03/22/2022 02:58 pm ----------------------------------------------------------------------   Results for orders placed or performed in visit on 04/22/22 (from the past 24 hour(s))  POCT urinalysis dipstick     Status: Abnormal   Collection Time: 04/22/22  9:51 AM  Result Value Ref Range   Color, UA yellow    Clarity, UA cloudy    Glucose, UA Negative Negative   Bilirubin, UA negative    Ketones, UA negative    Spec Grav, UA <=1.005 (A) 1.010 - 1.025   Blood, UA moderate    pH, UA 6.5 5.0 - 8.0   Protein, UA Negative Negative   Urobilinogen, UA 0.2 0.2 or 1.0 E.U./dL   Nitrite, UA positive    Leukocytes, UA Moderate (2+) (A) Negative   Appearance     Odor      Assessment and Plan:  Pregnancy: [id number] at [redacted]w[redacted]d 1. Vaginitis complicating current pregnancy Self-swab done, will follow up results and manage accordingly. Terazol prescribed presumptively (also given that she will be treated for UTI) - Cervicovaginal ancillary only( [place]) -  terconazole (TERAZOL 3) 0.8 % vaginal cream; Place 1 applicator vaginally at bedtime. Apply nightly for three nights.  Dispense: 20 g; Refill: 3  2. Urinary tract infection in mother during second trimester of pregnancy - POCT urinalysis dipstick positive for UTI - Culture, OB Urine sent, will follow up results and manage accordingly. - cefadroxil (DURICEF) 500 MG capsule; Take 1 capsule (500 mg total) by mouth 2 (two) times daily.  Dispense: 14 capsule; Refill: 0 -Prescribed for now. Told to call if symptoms worsen.  3. [redacted] weeks gestation of pregnancy 4. Supervision of other normal pregnancy, antepartum Follow up anatomy scan to be done today, will follow up results and manage accordingly. Preterm labor symptoms and general obstetric precautions including but not limited to vaginal bleeding, contractions, leaking of fluid and fetal movement were reviewed in detail with the patient. Please refer to After Visit Summary for other counseling recommendations.   Return in about 4 weeks (around 05/20/2022) for 2 hr GTT, 3rd trimester labs, OFFICE OB VISIT (MD or APP).  Future Appointments  Date Time Provider Department Center  04/22/2022 11:15 AM [facility] NURSE [facility]  04/22/2022 11:30 AM [facility] US3 [facility]    Jaynie Collins, MD

## 2022-04-23 LAB — CERVICOVAGINAL ANCILLARY ONLY
Bacterial Vaginitis (gardnerella): NEGATIVE
Candida Glabrata: NEGATIVE
Candida Vaginitis: NEGATIVE
Chlamydia: NEGATIVE
Comment: NEGATIVE
Comment: NEGATIVE
Comment: NEGATIVE
Comment: NEGATIVE
Comment: NEGATIVE
Comment: NORMAL
Neisseria Gonorrhea: NEGATIVE
Trichomonas: NEGATIVE

## 2022-04-25 LAB — CULTURE, OB URINE

## 2022-04-25 LAB — URINE CULTURE, OB REFLEX

## 2022-05-01 ENCOUNTER — Other Ambulatory Visit: Payer: Self-pay | Admitting: Emergency Medicine

## 2022-05-01 DIAGNOSIS — O2342 Unspecified infection of urinary tract in pregnancy, second trimester: Secondary | ICD-10-CM

## 2022-05-01 MED ORDER — CEFADROXIL 500 MG PO CAPS: 500.0000 mg | ORAL_CAPSULE | Freq: Two times a day (BID) | ORAL | 0 refills | Status: DC

## 2022-05-01 NOTE — Progress Notes (Signed)
RX re sent to pharmacy, states she lost the antibiotics.

## 2022-05-20 ENCOUNTER — Other Ambulatory Visit: Payer: Medicaid Other

## 2022-05-20 ENCOUNTER — Encounter: Payer: Self-pay | Admitting: Obstetrics and Gynecology

## 2022-05-20 ENCOUNTER — Ambulatory Visit (INDEPENDENT_AMBULATORY_CARE_PROVIDER_SITE_OTHER): Payer: Medicaid Other | Admitting: Obstetrics and Gynecology

## 2022-05-20 VITALS — BP 126/77 | HR 83 | Wt 278.0 lb

## 2022-05-20 DIAGNOSIS — Z3480 Encounter for supervision of other normal pregnancy, unspecified trimester: Secondary | ICD-10-CM

## 2022-05-20 DIAGNOSIS — O99212 Obesity complicating pregnancy, second trimester: Secondary | ICD-10-CM

## 2022-05-20 DIAGNOSIS — Z3A27 27 weeks gestation of pregnancy: Secondary | ICD-10-CM

## 2022-05-20 DIAGNOSIS — Z3482 Encounter for supervision of other normal pregnancy, second trimester: Secondary | ICD-10-CM

## 2022-05-20 DIAGNOSIS — O2343 Unspecified infection of urinary tract in pregnancy, third trimester: Secondary | ICD-10-CM

## 2022-05-20 DIAGNOSIS — O99210 Obesity complicating pregnancy, unspecified trimester: Secondary | ICD-10-CM | POA: Insufficient documentation

## 2022-05-20 DIAGNOSIS — O2342 Unspecified infection of urinary tract in pregnancy, second trimester: Secondary | ICD-10-CM

## 2022-05-20 DIAGNOSIS — R87612 Low grade squamous intraepithelial lesion on cytologic smear of cervix (LGSIL): Secondary | ICD-10-CM

## 2022-05-20 MED ORDER — ASPIRIN 81 MG PO TBEC: 81.0000 mg | DELAYED_RELEASE_TABLET | Freq: Every day | ORAL | 2 refills | Status: DC

## 2022-05-20 NOTE — Progress Notes (Signed)
   PRENATAL VISIT NOTE  Subjective:  Anita Herrera is a 26 y.o. [id number] at [redacted]w[redacted]d being seen today for ongoing prenatal care.  She is currently monitored for the following issues for this low-risk pregnancy and has Supervision of other normal pregnancy, antepartum; LGSIL of cervix of undetermined significance; UTI (urinary tract infection) during pregnancy; and Maternal obesity affecting pregnancy, antepartum on their problem list.  Patient reports no complaints.  Contractions: Irritability. Vag. Bleeding: None.  Movement: Present. Denies leaking of fluid.   The following portions of the patient's history were reviewed and updated as appropriate: allergies, current medications, past family history, past medical history, past social history, past surgical history and problem list.   Objective:   Vitals:   05/20/22 0908  BP: 126/77  Pulse: 83  Weight: 278 lb (126.1 kg)    Fetal Status: Fetal Heart Rate (bpm): 140 Fundal Height: 28 cm Movement: Present     General:  Alert, oriented and cooperative. Patient is in no acute distress.  Skin: Skin is warm and dry. No rash noted.   Cardiovascular: Normal heart rate noted  Respiratory: Normal respiratory effort, no problems with respiration noted  Abdomen: Soft, gravid, appropriate for gestational age.  Pain/Pressure: Absent     Pelvic: Cervical exam deferred        Extremities: Normal range of motion.     Mental Status: Normal mood and affect. Normal behavior. Normal judgment and thought content.   Assessment and Plan:  Pregnancy: [id number] at [redacted]w[redacted]d 1. Supervision of other normal pregnancy, antepartum Patient is doing well Third trimester labs and glucola today - Glucose Tolerance, 2 Hours w/1 Hour - CBC - RPR - HIV Antibody (routine testing w rflx)   2. LGSIL of cervix of undetermined significance Plan for postpartum colposcopy  3. Urinary tract infection in mother during third trimester of pregnancy Urine culture next visit   Patient completed antibiotic yesterday  4. Maternal obesity affecting pregnancy, antepartum Follow up growth ultrasound Rx ASA provided  Preterm labor symptoms and general obstetric precautions including but not limited to vaginal bleeding, contractions, leaking of fluid and fetal movement were reviewed in detail with the patient. Please refer to After Visit Summary for other counseling recommendations.   Return in about 2 weeks (around 06/03/2022) for in person, ROB, Low risk.  Future Appointments  Date Time Provider Department Center  05/20/2022  9:35 AM Cristalle Rohm, Gigi Gin, MD [facility] None  05/21/2022  1:30 PM [facility] NURSE [facility]  05/21/2022  1:45 PM [facility] US5 [facility]    Catalina Antigua, MD

## 2022-05-20 NOTE — Progress Notes (Signed)
Pt needs to discuss work and how she feels after working her shift. Restriction letter provided.

## 2022-05-21 ENCOUNTER — Ambulatory Visit: Payer: Medicaid Other | Attending: Obstetrics and Gynecology

## 2022-05-21 ENCOUNTER — Ambulatory Visit: Payer: Medicaid Other | Admitting: *Deleted

## 2022-05-21 ENCOUNTER — Other Ambulatory Visit: Payer: Self-pay | Admitting: *Deleted

## 2022-05-21 VITALS — BP 128/68 | HR 103

## 2022-05-21 DIAGNOSIS — O99210 Obesity complicating pregnancy, unspecified trimester: Secondary | ICD-10-CM

## 2022-05-21 DIAGNOSIS — Z6841 Body Mass Index (BMI) 40.0 and over, adult: Secondary | ICD-10-CM | POA: Insufficient documentation

## 2022-05-21 DIAGNOSIS — O99212 Obesity complicating pregnancy, second trimester: Secondary | ICD-10-CM

## 2022-05-21 DIAGNOSIS — Z362 Encounter for other antenatal screening follow-up: Secondary | ICD-10-CM | POA: Insufficient documentation

## 2022-05-21 DIAGNOSIS — Z3480 Encounter for supervision of other normal pregnancy, unspecified trimester: Secondary | ICD-10-CM

## 2022-05-21 DIAGNOSIS — E669 Obesity, unspecified: Secondary | ICD-10-CM | POA: Diagnosis not present

## 2022-05-21 DIAGNOSIS — Z3A28 28 weeks gestation of pregnancy: Secondary | ICD-10-CM | POA: Diagnosis not present

## 2022-05-21 DIAGNOSIS — Z3A27 27 weeks gestation of pregnancy: Secondary | ICD-10-CM

## 2022-05-21 LAB — CBC
Hematocrit: 31 % — ABNORMAL LOW (ref 34.0–46.6)
Hemoglobin: 10.2 g/dL — ABNORMAL LOW (ref 11.1–15.9)
MCH: 31.3 pg (ref 26.6–33.0)
MCHC: 32.9 g/dL (ref 31.5–35.7)
MCV: 95 fL (ref 79–97)
Platelets: 231 10*3/uL (ref 150–450)
RBC: 3.26 x10E6/uL — ABNORMAL LOW (ref 3.77–5.28)
RDW: 11.7 % (ref 11.7–15.4)
WBC: 7.3 10*3/uL (ref 3.4–10.8)

## 2022-05-21 LAB — GLUCOSE TOLERANCE, 2 HOURS W/ 1HR
Glucose, 1 hour: 145 mg/dL (ref 70–179)
Glucose, 2 hour: 149 mg/dL (ref 70–152)
Glucose, Fasting: 85 mg/dL (ref 70–91)

## 2022-05-21 LAB — RPR: RPR Ser Ql: NONREACTIVE

## 2022-05-21 LAB — HIV ANTIBODY (ROUTINE TESTING W REFLEX): HIV Screen 4th Generation wRfx: NONREACTIVE

## 2022-05-22 LAB — CULTURE, OB URINE

## 2022-05-22 LAB — URINE CULTURE, OB REFLEX: Organism ID, Bacteria: NO GROWTH

## 2022-06-11 ENCOUNTER — Ambulatory Visit (INDEPENDENT_AMBULATORY_CARE_PROVIDER_SITE_OTHER): Payer: Medicaid Other | Admitting: Obstetrics and Gynecology

## 2022-06-11 VITALS — BP 128/68 | HR 49 | Wt 277.0 lb

## 2022-06-11 DIAGNOSIS — Z3A30 30 weeks gestation of pregnancy: Secondary | ICD-10-CM

## 2022-06-11 DIAGNOSIS — R300 Dysuria: Secondary | ICD-10-CM

## 2022-06-11 DIAGNOSIS — O26893 Other specified pregnancy related conditions, third trimester: Secondary | ICD-10-CM | POA: Insufficient documentation

## 2022-06-11 DIAGNOSIS — O99210 Obesity complicating pregnancy, unspecified trimester: Secondary | ICD-10-CM

## 2022-06-11 DIAGNOSIS — R12 Heartburn: Secondary | ICD-10-CM

## 2022-06-11 DIAGNOSIS — Z3483 Encounter for supervision of other normal pregnancy, third trimester: Secondary | ICD-10-CM

## 2022-06-11 DIAGNOSIS — Z3480 Encounter for supervision of other normal pregnancy, unspecified trimester: Secondary | ICD-10-CM

## 2022-06-11 DIAGNOSIS — O99213 Obesity complicating pregnancy, third trimester: Secondary | ICD-10-CM

## 2022-06-11 LAB — POCT URINALYSIS DIPSTICK
Bilirubin, UA: NEGATIVE
Glucose, UA: NEGATIVE
Ketones, UA: NEGATIVE
Nitrite, UA: POSITIVE
Protein, UA: POSITIVE — AB
Spec Grav, UA: 1.015 (ref 1.010–1.025)
Urobilinogen, UA: 0.2 E.U./dL
pH, UA: 6.5 (ref 5.0–8.0)

## 2022-06-11 MED ORDER — PANTOPRAZOLE SODIUM 40 MG PO TBEC: 40.0000 mg | DELAYED_RELEASE_TABLET | Freq: Every day | ORAL | 1 refills | Status: DC

## 2022-06-11 MED ORDER — CEPHALEXIN 500 MG PO CAPS: 500.0000 mg | ORAL_CAPSULE | Freq: Three times a day (TID) | ORAL | 0 refills | Status: DC

## 2022-06-11 NOTE — Progress Notes (Signed)
Pt complains of urinary symptoms and reflux - Tums no longer helping.

## 2022-06-11 NOTE — Progress Notes (Signed)
   PRENATAL VISIT NOTE  Subjective:  Anita Herrera is a 26 y.o. I0X7353 at [redacted]w[redacted]d being seen today for ongoing prenatal care.  She is currently monitored for the following issues for this high-risk pregnancy and has Supervision of other normal pregnancy, antepartum; LGSIL of cervix of undetermined significance; UTI (urinary tract infection) during pregnancy; Maternal obesity affecting pregnancy, antepartum; Dysuria during pregnancy in third trimester; and Heartburn during pregnancy in third trimester on their problem list.  Patient doing well with no acute concerns today. She reports heartburn and dysuria .  Contractions: Not present. Vag. Bleeding: None.  Movement: Present. Denies leaking of fluid.   The following portions of the patient's history were reviewed and updated as appropriate: allergies, current medications, past family history, past medical history, past social history, past surgical history and problem list. Problem list updated.  Objective:   Vitals:   06/11/22 1121  BP: 128/68  Pulse: (!) 49  Weight: 277 lb (125.6 kg)    Fetal Status: Fetal Heart Rate (bpm): 140 Fundal Height: 31 cm Movement: Present     General:  Alert, oriented and cooperative. Patient is in no acute distress.  Skin: Skin is warm and dry. No rash noted.   Cardiovascular: Normal heart rate noted  Respiratory: Normal respiratory effort, no problems with respiration noted  Abdomen: Soft, gravid, appropriate for gestational age.  Pain/Pressure: Absent     Pelvic: Cervical exam deferred        Extremities: Normal range of motion.     Mental Status:  Normal mood and affect. Normal behavior. Normal judgment and thought content.   Assessment and Plan:  Pregnancy: [id number] at [redacted]w[redacted]d  1. [redacted] weeks gestation of pregnancy   2. Supervision of other normal pregnancy, antepartum Continue routine prenatal care  3. Maternal obesity affecting pregnancy, antepartum   4. Dysuria during pregnancy in third  trimester Urine dip suspicious for UTI, urine culture pending Will empirically treat  - Culture, OB Urine - POCT urinalysis dipstick - cephALEXin (KEFLEX) 500 MG capsule; Take 1 capsule (500 mg total) by mouth 3 (three) times daily.  Dispense: 21 capsule; Refill: 0  5. Heartburn during pregnancy in third trimester Pt states tums was ineffective, desires different treatment - pantoprazole (PROTONIX) 40 MG tablet; Take 1 tablet (40 mg total) by mouth daily.  Dispense: 30 tablet; Refill: 1  Preterm labor symptoms and general obstetric precautions including but not limited to vaginal bleeding, contractions, leaking of fluid and fetal movement were reviewed in detail with the patient.  Please refer to After Visit Summary for other counseling recommendations.   Return in about 2 weeks (around 06/25/2022) for [facility], in person.   Mariel Aloe, MD Faculty Attending Center [facility]

## 2022-06-13 LAB — URINE CULTURE, OB REFLEX

## 2022-06-13 LAB — CULTURE, OB URINE

## 2022-06-24 ENCOUNTER — Ambulatory Visit (INDEPENDENT_AMBULATORY_CARE_PROVIDER_SITE_OTHER): Payer: Medicaid Other | Admitting: Advanced Practice Midwife

## 2022-06-24 VITALS — BP 119/70 | HR 84 | Wt 284.0 lb

## 2022-06-24 DIAGNOSIS — Z3480 Encounter for supervision of other normal pregnancy, unspecified trimester: Secondary | ICD-10-CM

## 2022-06-24 DIAGNOSIS — Z3483 Encounter for supervision of other normal pregnancy, third trimester: Secondary | ICD-10-CM

## 2022-06-24 DIAGNOSIS — O26893 Other specified pregnancy related conditions, third trimester: Secondary | ICD-10-CM

## 2022-06-24 DIAGNOSIS — R102 Pelvic and perineal pain: Secondary | ICD-10-CM

## 2022-06-24 DIAGNOSIS — Z3A32 32 weeks gestation of pregnancy: Secondary | ICD-10-CM

## 2022-06-24 NOTE — Progress Notes (Signed)
   PRENATAL VISIT NOTE  Subjective:  Anita Herrera is a 26 y.o. [phone] at [redacted]w[redacted]d being seen today for ongoing prenatal care.  She is currently monitored for the following issues for this low-risk pregnancy and has Supervision of other normal pregnancy, antepartum; LGSIL of cervix of undetermined significance; UTI (urinary tract infection) during pregnancy; Maternal obesity affecting pregnancy, antepartum; Dysuria during pregnancy in third trimester; and Heartburn during pregnancy in third trimester on their problem list.  Patient reports  pelvic/hip/back pain with certain positions/movements .  Contractions: Not present. Vag. Bleeding: None.  Movement: Present. Denies leaking of fluid.   The following portions of the patient's history were reviewed and updated as appropriate: allergies, current medications, past family history, past medical history, past social history, past surgical history and problem list.   Objective:   Vitals:   06/24/22 1009  BP: 119/70  Pulse: 84  Weight: 284 lb (128.8 kg)    Fetal Status: Fetal Heart Rate (bpm): 140   Movement: Present     General:  Alert, oriented and cooperative. Patient is in no acute distress.  Skin: Skin is warm and dry. No rash noted.   Cardiovascular: Normal heart rate noted  Respiratory: Normal respiratory effort, no problems with respiration noted  Abdomen: Soft, gravid, appropriate for gestational age.  Pain/Pressure: Absent     Pelvic: Cervical exam deferred        Extremities: Normal range of motion.  Edema: None  Mental Status: Normal mood and affect. Normal behavior. Normal judgment and thought content.   Assessment and Plan:  Pregnancy: [id number] at [redacted]w[redacted]d 1. Supervision of other normal pregnancy, antepartum --Anticipatory guidance about next visits/weeks of pregnancy given.   2. [redacted] weeks gestation of pregnancy   3. Pelvic pain affecting pregnancy in third trimester, antepartum --Pain when going from lying down to  sitting position, or pain when sitting for long periods --No regular cramping, no other associated symptoms - Ambulatory referral to Physical Therapy   Preterm labor symptoms and general obstetric precautions including but not limited to vaginal bleeding, contractions, leaking of fluid and fetal movement were reviewed in detail with the patient. Please refer to After Visit Summary for other counseling recommendations.   No follow-ups on file.  Future Appointments  Date Time Provider [place]  06/25/2022 11:15 AM [facility] NURSE [facility]  06/25/2022 11:30 AM [facility] US3 [facility]  07/02/2022 11:15 AM [facility] NURSE [facility]  07/02/2022 11:30 AM [facility] US3 [facility]  07/08/2022 10:35 AM Constant, Peggy, MD [facility] None  07/09/2022 11:15 AM [facility] NURSE [facility]  07/09/2022 11:30 AM [facility] US3 [facility]  07/22/2022  9:55 AM Leftwich-Kirby, Kathie Dike, CNM [facility] None  07/29/2022  9:35 AM Constant, Vickii Chafe, MD [place] None  08/05/2022  9:35 AM Leftwich-Kirby, Kathie Dike, CNM [facility] None    Fatima Blank, CNM

## 2022-06-25 ENCOUNTER — Ambulatory Visit: Payer: Medicaid Other | Attending: Obstetrics

## 2022-06-25 ENCOUNTER — Ambulatory Visit: Payer: Medicaid Other | Admitting: *Deleted

## 2022-06-25 ENCOUNTER — Encounter: Payer: Self-pay | Admitting: *Deleted

## 2022-06-25 VITALS — BP 130/60 | HR 88

## 2022-06-25 DIAGNOSIS — E669 Obesity, unspecified: Secondary | ICD-10-CM | POA: Diagnosis not present

## 2022-06-25 DIAGNOSIS — Z3480 Encounter for supervision of other normal pregnancy, unspecified trimester: Secondary | ICD-10-CM

## 2022-06-25 DIAGNOSIS — O403XX0 Polyhydramnios, third trimester, not applicable or unspecified: Secondary | ICD-10-CM | POA: Diagnosis not present

## 2022-06-25 DIAGNOSIS — O99210 Obesity complicating pregnancy, unspecified trimester: Secondary | ICD-10-CM | POA: Diagnosis present

## 2022-06-25 DIAGNOSIS — Z3A32 32 weeks gestation of pregnancy: Secondary | ICD-10-CM

## 2022-06-25 DIAGNOSIS — O99213 Obesity complicating pregnancy, third trimester: Secondary | ICD-10-CM | POA: Diagnosis not present

## 2022-06-25 DIAGNOSIS — Z362 Encounter for other antenatal screening follow-up: Secondary | ICD-10-CM | POA: Diagnosis not present

## 2022-06-25 DIAGNOSIS — O99212 Obesity complicating pregnancy, second trimester: Secondary | ICD-10-CM | POA: Diagnosis present

## 2022-06-25 DIAGNOSIS — O321XX0 Maternal care for breech presentation, not applicable or unspecified: Secondary | ICD-10-CM | POA: Insufficient documentation

## 2022-07-01 NOTE — Therapy (Unsigned)
OUTPATIENT PHYSICAL THERAPY FEMALE PELVIC EVALUATION   Patient Name: Anita Herrera MRN: [id number] DOB:04/07/1996, 26 y.o., female Today's Date: 07/02/2022   PT End of Session - 07/02/22 1404     Visit Number 1    Date for PT Re-Evaluation 08/27/22    Authorization Type medicaid [employer]    PT Start Time [id number]    PT Stop Time 1434    PT Time Calculation (min) 32 min    Activity Tolerance Patient tolerated treatment well    Behavior During Therapy WFL for tasks assessed/performed             Past Medical History:  Diagnosis Date   Anemia    Shingles    Vaginitis    History reviewed. No pertinent surgical history. Patient Active Problem List   Diagnosis Date Noted   Dysuria during pregnancy in third trimester 06/11/2022   Heartburn during pregnancy in third trimester 06/11/2022   Maternal obesity affecting pregnancy, antepartum 05/20/2022   LGSIL of cervix of undetermined significance 02/26/2022   UTI (urinary tract infection) during pregnancy 02/26/2022   Supervision of other normal pregnancy, antepartum 02/07/2022    PCP: none  REFERRING PROVIDER: Elvera Maria, CNM  REFERRING DIAG: [phone] (ICD-10-CM) - Pelvic pain affecting pregnancy in third trimester, antepartum  THERAPY DIAG:  Abnormal posture  Muscle weakness (generalized)  Rationale for Evaluation and Treatment Rehabilitation  ONSET DATE: 1-2 months  SUBJECTIVE:                                                                                                                                                                                           SUBJECTIVE STATEMENT: Groin and lower abdominal pain began 1-2 months ago.  Pt says when lying down or sitting and then going to stand up Fluid intake: Yes: water     PAIN:  Are you having pain? Yes NPRS scale: 7-8/10 Pain location: lower abdomen  Pain type: aching Pain description: intermittent   Aggravating factors: standing or  getting up after lying/sitting Relieving factors: getting up and moving around for a few minutes  PRECAUTIONS: Other: 33 weeks antepartum  WEIGHT BEARING RESTRICTIONS No  FALLS:  Has patient fallen in last 6 months? No  LIVING ENVIRONMENT: Lives with: lives with their family and 2 daughters Lives in: House/apartment   OCCUPATION: full time - standing and lifting  PLOF: Independent  PATIENT GOALS not have the pain  PERTINENT HISTORY:  Antepartum 33 weeks; 2 previous pregnancies Sexual abuse: No  BOWEL MOVEMENT No issues  URINATION Pain with urination: No  Leakage:  no   INTERCOURSE Pain with intercourse:  No  PREGNANCY Vaginal deliveries 2 Tearing Yes: first one   PROLAPSE None    OBJECTIVE:   DIAGNOSTIC FINDINGS:    PATIENT SURVEYS:    PFIQ-7 = 24  COGNITION:  Overall cognitive status: Within functional limits for tasks assessed     SENSATION:   MUSCLE LENGTH: Hamstrings: Thomas test:   LUMBAR SPECIAL TESTS:    FUNCTIONAL TESTS:  Single leg stand - some instability - no pain  GAIT:  Comments: wide base of support               POSTURE: decreased thoracic kyphosis and anterior pelvic tilt   PELVIC ALIGNMENT:  LUMBARAROM/PROM Not tested A/PROM A/PROM  eval  Flexion   Extension   Right lateral flexion   Left lateral flexion   Right rotation   Left rotation    (Blank rows = not tested)  LOWER EXTREMITY ROM:  Not tested due to time Passive ROM Right eval Left eval  Hip flexion    Hip extension    Hip abduction    Hip adduction    Hip internal rotation    Hip external rotation    Knee flexion    Knee extension    Ankle dorsiflexion    Ankle plantarflexion    Ankle inversion    Ankle eversion     (Blank rows = not tested)  LOWER EXTREMITY MMT:  MMT Right eval Left eval  Hip flexion 4/5 pain 4/5 pain  Hip extension    Hip abduction    Hip adduction    Hip internal rotation    Hip external rotation     Knee flexion    Knee extension    Ankle dorsiflexion    Ankle plantarflexion    Ankle inversion    Ankle eversion      PALPATION:   General  lumbar paraspinals tight; pelvic posterior tilt increased pain                External Perineal Exam normal but little movement with contract and relax                             Internal Pelvic Floor 4/5 MMT bil; tender to palpation  Patient confirms identification and approves PT to assess internal pelvic floor and treatment Yes No emotional/communication barriers or cognitive limitation. Patient is motivated to learn. Patient understands and agrees with treatment goals and plan. PT explains patient will be examined in standing, sitting, and lying down to see how their muscles and joints work. When they are ready, they will be asked to remove their underwear so PT can examine their perineum. The patient is also given the option of providing their own chaperone as one is not provided in our facility. The patient also has the right and is explained the right to defer or refuse any part of the evaluation or treatment including the internal exam. With the patient's consent, PT will use one gloved finger to gently assess the muscles of the pelvic floor, seeing how well it contracts and relaxes and if there is muscle symmetry. After, the patient will get dressed and PT and patient will discuss exam findings and plan of care. PT and patient discuss plan of care, schedule, attendance policy and HEP activities.  PELVIC MMT:   MMT eval  Vaginal 4/5 x 3 sec  Internal Anal Sphincter   External Anal Sphincter   Puborectalis   Diastasis Recti   (  Blank rows = not tested)        TONE: High Rt>Lt  PROLAPSE: no  TODAY'S TREATMENT  EVAL and initial HEP given   PATIENT EDUCATION:  Education details: Access Code: [email or web address] Person educated: Patient Education method: Explanation, Demonstration, Verbal cues, and Handouts Education comprehension: verbalized  understanding and returned demonstration   HOME EXERCISE PROGRAM: Access Code: [email or web address] URL: [email or web address] Date: 07/02/2022 Prepared by: Jari Favre  Exercises - Down dog on table or chair  - 3 x daily - 7 x weekly - 1 sets - 8 reps - Sidelying Transversus Abdominis Bracing  - 1 x daily - 7 x weekly - 3 sets - 10 reps  ASSESSMENT:  CLINICAL IMPRESSION: Patient is a 26 y.o. female who was seen today for physical therapy evaluation and treatment for pelvic pain during pregnancy.  Pt having bil groin pain that occurs with sitting and lying down for long periods of time.  Pt has tension throughout the pelvic floor bil levators with more tension in the Rt levators and some tenderness when palpating . Pt has 4/5 MMT with 3 sec holds.  Pt has posture abnormalities and difficulty with pelvic and trunk stability with functional single leg stand.  She will benefit from skilled PT to address the impairments mentioned above and improve function during pregnancy.   OBJECTIVE IMPAIRMENTS decreased activity tolerance, decreased coordination, decreased ROM, decreased strength, increased muscle spasms, impaired flexibility, postural dysfunction, and pain.   ACTIVITY LIMITATIONS sitting, sleeping, and transfers  PARTICIPATION LIMITATIONS: community activity and sitting activities  PERSONAL FACTORS 1-2 comorbidities: 2 vaginal deliveries; 33 weeks antepartum  are also affecting patient's functional outcome.   REHAB POTENTIAL: Excellent  CLINICAL DECISION MAKING: Evolving/moderate complexity  EVALUATION COMPLEXITY: Low   GOALS: Goals reviewed with patient? Yes  SHORT TERM GOALS: Target date: 07/30/2022  Ind with initial HEP Baseline: Goal status: INITIAL    LONG TERM GOALS: Target date: 08/27/2022   Pt will be independent with advanced HEP to maintain improvements made throughout therapy  Baseline:  Goal status: INITIAL  2.  Pt will report 80% reduction  of pain due to improvements in posture, strength, and muscle length  Baseline:  Goal status: INITIAL  3.  Pt will be able to sit for at least 2 hours without pain when standing Baseline:  Goal status: INITIAL  4.  Pt will be able to perform bed mobility correctly without pain Baseline:  Goal status: INITIAL   PLAN: PT FREQUENCY: 1x/week  PT DURATION: 12 weeks  PLANNED INTERVENTIONS: Therapeutic exercises, Therapeutic activity, Neuromuscular re-education, Balance training, Gait training, Patient/Family education, Self Care, Joint mobilization, Aquatic Therapy, Dry Needling, Electrical stimulation, Cryotherapy, Moist heat, Taping, Biofeedback, Manual therapy, and Re-evaluation  PLAN FOR NEXT SESSION: round ligament releases and abdominal strength   Camillo Flaming Lorenso Quirino, PT 07/02/2022, 3:07 PM

## 2022-07-02 ENCOUNTER — Other Ambulatory Visit: Payer: Self-pay

## 2022-07-02 ENCOUNTER — Ambulatory Visit: Payer: Medicaid Other | Attending: Obstetrics

## 2022-07-02 ENCOUNTER — Ambulatory Visit: Payer: Medicaid Other | Attending: Advanced Practice Midwife | Admitting: Physical Therapy

## 2022-07-02 ENCOUNTER — Other Ambulatory Visit: Payer: Self-pay | Admitting: *Deleted

## 2022-07-02 ENCOUNTER — Encounter: Payer: Self-pay | Admitting: Physical Therapy

## 2022-07-02 ENCOUNTER — Ambulatory Visit: Payer: Medicaid Other | Admitting: *Deleted

## 2022-07-02 VITALS — BP 141/56 | HR 51

## 2022-07-02 DIAGNOSIS — O99213 Obesity complicating pregnancy, third trimester: Secondary | ICD-10-CM | POA: Insufficient documentation

## 2022-07-02 DIAGNOSIS — R293 Abnormal posture: Secondary | ICD-10-CM | POA: Diagnosis present

## 2022-07-02 DIAGNOSIS — O99212 Obesity complicating pregnancy, second trimester: Secondary | ICD-10-CM | POA: Diagnosis not present

## 2022-07-02 DIAGNOSIS — O99210 Obesity complicating pregnancy, unspecified trimester: Secondary | ICD-10-CM

## 2022-07-02 DIAGNOSIS — E669 Obesity, unspecified: Secondary | ICD-10-CM

## 2022-07-02 DIAGNOSIS — O26893 Other specified pregnancy related conditions, third trimester: Secondary | ICD-10-CM | POA: Diagnosis not present

## 2022-07-02 DIAGNOSIS — Z3480 Encounter for supervision of other normal pregnancy, unspecified trimester: Secondary | ICD-10-CM

## 2022-07-02 DIAGNOSIS — Z362 Encounter for other antenatal screening follow-up: Secondary | ICD-10-CM | POA: Diagnosis not present

## 2022-07-02 DIAGNOSIS — M6281 Muscle weakness (generalized): Secondary | ICD-10-CM | POA: Insufficient documentation

## 2022-07-02 DIAGNOSIS — Z3A33 33 weeks gestation of pregnancy: Secondary | ICD-10-CM | POA: Diagnosis not present

## 2022-07-02 DIAGNOSIS — R102 Pelvic and perineal pain: Secondary | ICD-10-CM | POA: Diagnosis not present

## 2022-07-08 ENCOUNTER — Other Ambulatory Visit (HOSPITAL_COMMUNITY)
Admission: RE | Admit: 2022-07-08 | Discharge: 2022-07-08 | Disposition: A | Discharge status: Home / Self Care | Payer: Medicaid Other | Source: Ambulatory Visit | Attending: Obstetrics and Gynecology | Admitting: Obstetrics and Gynecology

## 2022-07-08 ENCOUNTER — Ambulatory Visit: Payer: Medicaid Other | Attending: Advanced Practice Midwife | Admitting: Physical Therapy

## 2022-07-08 ENCOUNTER — Encounter: Payer: Self-pay | Admitting: Physical Therapy

## 2022-07-08 ENCOUNTER — Encounter: Payer: Self-pay | Admitting: Obstetrics and Gynecology

## 2022-07-08 ENCOUNTER — Ambulatory Visit (INDEPENDENT_AMBULATORY_CARE_PROVIDER_SITE_OTHER): Payer: Medicaid Other | Admitting: Obstetrics and Gynecology

## 2022-07-08 VITALS — BP 135/83 | HR 90 | Wt 282.0 lb

## 2022-07-08 DIAGNOSIS — R300 Dysuria: Secondary | ICD-10-CM

## 2022-07-08 DIAGNOSIS — Z3A34 34 weeks gestation of pregnancy: Secondary | ICD-10-CM

## 2022-07-08 DIAGNOSIS — O99213 Obesity complicating pregnancy, third trimester: Secondary | ICD-10-CM

## 2022-07-08 DIAGNOSIS — M6281 Muscle weakness (generalized): Secondary | ICD-10-CM | POA: Diagnosis present

## 2022-07-08 DIAGNOSIS — R293 Abnormal posture: Secondary | ICD-10-CM | POA: Insufficient documentation

## 2022-07-08 DIAGNOSIS — Z3480 Encounter for supervision of other normal pregnancy, unspecified trimester: Secondary | ICD-10-CM

## 2022-07-08 DIAGNOSIS — O26893 Other specified pregnancy related conditions, third trimester: Secondary | ICD-10-CM

## 2022-07-08 DIAGNOSIS — O99210 Obesity complicating pregnancy, unspecified trimester: Secondary | ICD-10-CM

## 2022-07-08 LAB — POCT URINALYSIS DIPSTICK
Bilirubin, UA: NEGATIVE
Glucose, UA: NEGATIVE
Ketones, UA: NEGATIVE
Nitrite, UA: POSITIVE
Protein, UA: POSITIVE — AB
Spec Grav, UA: 1.015 (ref 1.010–1.025)
Urobilinogen, UA: 0.2 E.U./dL
pH, UA: 6.5 (ref 5.0–8.0)

## 2022-07-08 NOTE — Progress Notes (Signed)
   PRENATAL VISIT NOTE  Subjective:  Anita Herrera is a 26 y.o. [id number] at [redacted]w[redacted]d being seen today for ongoing prenatal care.  She is currently monitored for the following issues for this low-risk pregnancy and has Supervision of other normal pregnancy, antepartum; LGSIL of cervix of undetermined significance; UTI (urinary tract infection) during pregnancy; Maternal obesity affecting pregnancy, antepartum; Dysuria during pregnancy in third trimester; and Heartburn during pregnancy in third trimester on their problem list.  Patient reports no complaints.  Contractions: Not present. Vag. Bleeding: None.  Movement: Present. Denies leaking of fluid.   The following portions of the patient's history were reviewed and updated as appropriate: allergies, current medications, past family history, past medical history, past social history, past surgical history and problem list.   Objective:   Vitals:   07/08/22 1058  BP: 135/83  Pulse: 90  Weight: 282 lb (127.9 kg)    Fetal Status: Fetal Heart Rate (bpm): 140 Fundal Height: 34 cm Movement: Present     General:  Alert, oriented and cooperative. Patient is in no acute distress.  Skin: Skin is warm and dry. No rash noted.   Cardiovascular: Normal heart rate noted  Respiratory: Normal respiratory effort, no problems with respiration noted  Abdomen: Soft, gravid, appropriate for gestational age.  Pain/Pressure: Absent     Pelvic: Cervical exam deferred        Extremities: Normal range of motion.     Mental Status: Normal mood and affect. Normal behavior. Normal judgment and thought content.   Assessment and Plan:  Pregnancy: [id number] at [redacted]w[redacted]d 1. Supervision of other normal pregnancy, antepartum Patient is doing well without complaints Cultures next visit  2. Obesity affecting pregnancy, antepartum, unspecified obesity type Continue ASA Follow up ultrasound per MFM schedule  3. Dysuria during pregnancy in third trimester Urine culture and  vaginal swab collected  Preterm labor symptoms and general obstetric precautions including but not limited to vaginal bleeding, contractions, leaking of fluid and fetal movement were reviewed in detail with the patient. Please refer to After Visit Summary for other counseling recommendations.   Return in about 2 weeks (around 07/22/2022) for in person, ROB, Low risk.  Future Appointments  Date Time Provider [place]  07/08/2022 12:30 PM Desenglau, Tommy Rainwater, PT [facility] None  07/09/2022 11:15 AM [facility] NURSE [facility]  07/09/2022 11:30 AM [facility] US3 [facility]  07/16/2022 12:30 PM [facility] NURSE [facility]  07/16/2022 12:45 PM [facility] US6 [facility]  07/22/2022  9:55 AM Leftwich-Kirby, Kathie Dike, CNM [facility] None  07/22/2022 12:30 PM Desenglau, Tommy Rainwater, PT [facility] None  07/23/2022 12:30 PM [facility] NURSE [facility]  07/23/2022 12:45 PM [facility] US6 [facility]  07/29/2022  9:35 AM Jacole Capley, MD [place] None  07/30/2022 12:30 PM [facility] NURSE [facility]  07/30/2022 12:45 PM [facility] US4 [facility]  08/01/2022 12:30 PM Desenglau, Tommy Rainwater, PT [facility] None  08/05/2022  9:35 AM Leftwich-Kirby, Kathie Dike, CNM [facility] None  08/05/2022  2:45 PM Desenglau, Tommy Rainwater, PT [facility] None    Mora Bellman, MD

## 2022-07-08 NOTE — Progress Notes (Signed)
Pt complains of cloudy urine and  vaginal d/c with odor - will do self swab today.

## 2022-07-08 NOTE — Therapy (Addendum)
OUTPATIENT PHYSICAL THERAPY FEMALE PELVIC TREATMENT   Patient Name: Anita Herrera MRN: [id number] DOB:06-13-1996, 26 y.o., female Today's Date: 07/08/2022   PT End of Session - 07/08/22 1305     Visit Number 2    Number of Visits 5    Date for PT Re-Evaluation 08/27/22    Authorization Type medicaid [employer] - 4 visits approved    PT Start Time 9798    PT Stop Time 1315    PT Time Calculation (min) 40 min    Activity Tolerance Patient tolerated treatment well    Behavior During Therapy WFL for tasks assessed/performed              Past Medical History:  Diagnosis Date   Anemia    Shingles    Vaginitis    No past surgical history on file. Patient Active Problem List   Diagnosis Date Noted   Dysuria during pregnancy in third trimester 06/11/2022   Heartburn during pregnancy in third trimester 06/11/2022   Maternal obesity affecting pregnancy, antepartum 05/20/2022   LGSIL of cervix of undetermined significance 02/26/2022   UTI (urinary tract infection) during pregnancy 02/26/2022   Supervision of other normal pregnancy, antepartum 02/07/2022    PCP: none  REFERRING PROVIDER: Elvera Maria, CNM  REFERRING DIAG: [phone] (ICD-10-CM) - Pelvic pain affecting pregnancy in third trimester, antepartum  THERAPY DIAG:  Abnormal posture  Muscle weakness (generalized)  Rationale for Evaluation and Treatment Rehabilitation  ONSET DATE: 1-2 months  SUBJECTIVE:                                                                                                                                                                                          Pain was there when I was in the car.  After walking a little it goes away.     EVAL SUBJECTIVE STATEMENT: Groin and lower abdominal pain began 1-2 months ago.  Pt says when lying down or sitting and then going to stand up Fluid intake: Yes: water     PAIN:  Are you having pain? Yes NPRS scale: 3/10 Pain  location: lower abdomen  Pain type: aching Pain description: intermittent   Aggravating factors: standing or getting up after lying/sitting Relieving factors: getting up and moving around for a few minutes  PRECAUTIONS: Other: 33 weeks antepartum  WEIGHT BEARING RESTRICTIONS No  FALLS:  Has patient fallen in last 6 months? No  LIVING ENVIRONMENT: Lives with: lives with their family and 2 daughters Lives in: House/apartment   OCCUPATION: full time - standing and lifting  PLOF: Independent  PATIENT GOALS not have the pain  PERTINENT HISTORY:  Antepartum 33 weeks; 2 previous pregnancies Sexual abuse: No  BOWEL MOVEMENT No issues  URINATION Pain with urination: No  Leakage:  no   INTERCOURSE Pain with intercourse:  No   PREGNANCY Vaginal deliveries 2 Tearing Yes: first one   PROLAPSE None    OBJECTIVE:   DIAGNOSTIC FINDINGS:    PATIENT SURVEYS:    PFIQ-7 = 24  COGNITION:  Overall cognitive status: Within functional limits for tasks assessed     SENSATION:   MUSCLE LENGTH: Hamstrings: Thomas test:   LUMBAR SPECIAL TESTS:    FUNCTIONAL TESTS:  Single leg stand - some instability - no pain  GAIT:  Comments: wide base of support               POSTURE: decreased thoracic kyphosis and anterior pelvic tilt   PELVIC ALIGNMENT:  LUMBARAROM/PROM Not tested A/PROM A/PROM  eval  Flexion   Extension   Right lateral flexion   Left lateral flexion   Right rotation   Left rotation    (Blank rows = not tested)  LOWER EXTREMITY ROM:  Not tested due to time Passive ROM Right eval Left eval  Hip flexion    Hip extension    Hip abduction    Hip adduction    Hip internal rotation    Hip external rotation    Knee flexion    Knee extension    Ankle dorsiflexion    Ankle plantarflexion    Ankle inversion    Ankle eversion     (Blank rows = not tested)  LOWER EXTREMITY MMT:  MMT Right eval Left eval  Hip flexion 4/5 pain 4/5  pain  Hip extension    Hip abduction    Hip adduction    Hip internal rotation    Hip external rotation    Knee flexion    Knee extension    Ankle dorsiflexion    Ankle plantarflexion    Ankle inversion    Ankle eversion      PALPATION:   General  lumbar paraspinals tight; pelvic posterior tilt increased pain                External Perineal Exam normal but little movement with contract and relax                             Internal Pelvic Floor 4/5 MMT bil; tender to palpation  Patient confirms identification and approves PT to assess internal pelvic floor and treatment Yes No emotional/communication barriers or cognitive limitation. Patient is motivated to learn. Patient understands and agrees with treatment goals and plan. PT explains patient will be examined in standing, sitting, and lying down to see how their muscles and joints work. When they are ready, they will be asked to remove their underwear so PT can examine their perineum. The patient is also given the option of providing their own chaperone as one is not provided in our facility. The patient also has the right and is explained the right to defer or refuse any part of the evaluation or treatment including the internal exam. With the patient's consent, PT will use one gloved finger to gently assess the muscles of the pelvic floor, seeing how well it contracts and relaxes and if there is muscle symmetry. After, the patient will get dressed and PT and patient will discuss exam findings and plan of care. PT and patient discuss plan of care, schedule, attendance policy  and HEP activities.  PELVIC MMT:   MMT eval  Vaginal 4/5 x 3 sec  Internal Anal Sphincter   External Anal Sphincter   Puborectalis   Diastasis Recti   (Blank rows = not tested)        TONE: High Rt>Lt  PROLAPSE: no  TODAY'S TREATMENT  Date: 07/08/22 Self care: Tennis ball massage against the wall glutes and lumbar  Nuero Re-ed: Education and cues  for coordination of breathing  Transversus abdominus activation  Exercises: Hip flexor stretch - 3 x 30 sec bil Mini lunge - 10x bil Quadruped UE alternating - 10x  Manual:  Round ligament release Rt side from pubis to labia and lateral abdominal fascial release   PATIENT EDUCATION:  Education details: Access Code: [email or web address] Person educated: Patient Education method: Consulting civil engineer, Demonstration, Verbal cues, and Handouts Education comprehension: verbalized understanding and returned demonstration   HOME EXERCISE PROGRAM: Access Code: [email or web address] URL: [email or web address] Date: 07/08/2022 Prepared by: Jari Favre  Exercises - Down dog on table or chair  - 3 x daily - 7 x weekly - 1 sets - 8 reps - Sidelying Transversus Abdominis Bracing  - 1 x daily - 7 x weekly - 3 sets - 10 reps - Reverse Lunge with Dowel Rod Across Shoulders  - 1 x daily - 7 x weekly - 3 sets - 10 reps - Seated Hip Flexor Stretch  - 1 x daily - 7 x weekly - 3 sets - 10 reps - Seated Hip Flexor Stretch on Swiss Ball  - 1 x daily - 7 x weekly - 3 sets - 10 reps - Quadruped Weight-Bearing Activity: Reaching  - 1 x daily - 7 x weekly - 3 sets - 10 reps  ASSESSMENT:  CLINICAL IMPRESSION: Today's session focused on pain management with STM and fascial release. Following STM pt was given stretches and tennis ball massage so she can maintain increased sof tissue length at home.  Pt demonstrates pregnancy related postural changes and decreased abdominal and gluteal activity with overactive hip flexors and quads.  Stretches and exercises to build on improved posture were given today.  Pt will need skilled PT to continue to address these impairments for improved function during pregnancy.  OBJECTIVE IMPAIRMENTS decreased activity tolerance, decreased coordination, decreased ROM, decreased strength, increased muscle spasms, impaired flexibility, postural dysfunction, and pain.   ACTIVITY LIMITATIONS  sitting, sleeping, and transfers  PARTICIPATION LIMITATIONS: community activity and sitting activities  PERSONAL FACTORS 1-2 comorbidities: 2 vaginal deliveries; 33 weeks antepartum  are also affecting patient's functional outcome.   REHAB POTENTIAL: Excellent  CLINICAL DECISION MAKING: Evolving/moderate complexity  EVALUATION COMPLEXITY: Low   GOALS: Goals reviewed with patient? Yes  SHORT TERM GOALS: Target date: 07/30/2022  Ind with initial HEP Baseline: Goal status: MET    LONG TERM GOALS: Target date: 08/27/2022   Pt will be independent with advanced HEP to maintain improvements made throughout therapy  Baseline:  Goal status: INITIAL  2.  Pt will report 80% reduction of pain due to improvements in posture, strength, and muscle length  Baseline:  Goal status: INITIAL  3.  Pt will be able to sit for at least 2 hours without pain when standing Baseline:  Goal status: INITIAL  4.  Pt will be able to perform bed mobility correctly without pain Baseline:  Goal status: INITIAL   PLAN: PT FREQUENCY: 1x/week  PT DURATION: 12 weeks  PLANNED INTERVENTIONS: Therapeutic exercises, Therapeutic activity, Neuromuscular re-education, Balance training, Gait training, Patient/Family education, Self Care,  Joint mobilization, Aquatic Therapy, Dry Needling, Electrical stimulation, Cryotherapy, Moist heat, Taping, Biofeedback, Manual therapy, and Re-evaluation  PLAN FOR NEXT SESSION: f/u on added exercises; hip flexor and quad stretch and STM; abdominal fascial release; gluteal activation and transversus abdominus activation tall and half kneel as able   [employer], PT 07/08/2022, 2:12 PM PHYSICAL THERAPY DISCHARGE SUMMARY  Visits from Start of Care: 2  Current functional level related to goals / functional outcomes: See above goals   Remaining deficits: See above   Education / Equipment: HEP   Patient agrees to discharge. Patient goals were not met. Patient  is being discharged due to not returning since the last visit.  Gustavus Bryant, PT 10/09/22 12:26 PM

## 2022-07-09 ENCOUNTER — Ambulatory Visit: Payer: Medicaid Other | Attending: Obstetrics

## 2022-07-09 ENCOUNTER — Ambulatory Visit: Payer: Medicaid Other | Admitting: *Deleted

## 2022-07-09 VITALS — BP 127/52 | HR 76

## 2022-07-09 DIAGNOSIS — O99213 Obesity complicating pregnancy, third trimester: Secondary | ICD-10-CM | POA: Diagnosis not present

## 2022-07-09 DIAGNOSIS — Z3A34 34 weeks gestation of pregnancy: Secondary | ICD-10-CM | POA: Diagnosis not present

## 2022-07-09 DIAGNOSIS — O99210 Obesity complicating pregnancy, unspecified trimester: Secondary | ICD-10-CM

## 2022-07-09 DIAGNOSIS — Z3480 Encounter for supervision of other normal pregnancy, unspecified trimester: Secondary | ICD-10-CM

## 2022-07-09 DIAGNOSIS — Z362 Encounter for other antenatal screening follow-up: Secondary | ICD-10-CM | POA: Insufficient documentation

## 2022-07-09 DIAGNOSIS — E669 Obesity, unspecified: Secondary | ICD-10-CM

## 2022-07-09 DIAGNOSIS — O99212 Obesity complicating pregnancy, second trimester: Secondary | ICD-10-CM | POA: Insufficient documentation

## 2022-07-09 LAB — CERVICOVAGINAL ANCILLARY ONLY
Bacterial Vaginitis (gardnerella): NEGATIVE
Candida Glabrata: NEGATIVE
Candida Vaginitis: POSITIVE — AB
Chlamydia: NEGATIVE
Comment: NEGATIVE
Comment: NEGATIVE
Comment: NEGATIVE
Comment: NEGATIVE
Comment: NEGATIVE
Comment: NORMAL
Neisseria Gonorrhea: NEGATIVE
Trichomonas: NEGATIVE

## 2022-07-11 MED ORDER — TERCONAZOLE 0.8 % VA CREA: 1.0000 | TOPICAL_CREAM | Freq: Every day | VAGINAL | 0 refills | Status: DC

## 2022-07-11 NOTE — Addendum Note (Signed)
Addended by: Mora Bellman on: 07/11/2022 08:49 AM   Modules accepted: Orders

## 2022-07-12 LAB — URINE CULTURE, OB REFLEX

## 2022-07-12 LAB — CULTURE, OB URINE

## 2022-07-16 ENCOUNTER — Ambulatory Visit: Payer: Medicaid Other | Admitting: *Deleted

## 2022-07-16 ENCOUNTER — Ambulatory Visit: Payer: Medicaid Other | Attending: Obstetrics and Gynecology

## 2022-07-16 VITALS — BP 131/70 | HR 87

## 2022-07-16 DIAGNOSIS — O99210 Obesity complicating pregnancy, unspecified trimester: Secondary | ICD-10-CM

## 2022-07-16 DIAGNOSIS — O99213 Obesity complicating pregnancy, third trimester: Secondary | ICD-10-CM | POA: Insufficient documentation

## 2022-07-16 DIAGNOSIS — Z3480 Encounter for supervision of other normal pregnancy, unspecified trimester: Secondary | ICD-10-CM | POA: Diagnosis present

## 2022-07-16 DIAGNOSIS — Z3A35 35 weeks gestation of pregnancy: Secondary | ICD-10-CM | POA: Diagnosis not present

## 2022-07-17 MED ORDER — CEPHALEXIN 500 MG PO CAPS: 500.0000 mg | ORAL_CAPSULE | Freq: Four times a day (QID) | ORAL | 2 refills | Status: DC

## 2022-07-17 NOTE — Addendum Note (Signed)
Addended by: Mora Bellman on: 07/17/2022 07:26 AM   Modules accepted: Orders

## 2022-07-22 ENCOUNTER — Ambulatory Visit (INDEPENDENT_AMBULATORY_CARE_PROVIDER_SITE_OTHER): Payer: Medicaid Other | Admitting: Advanced Practice Midwife

## 2022-07-22 ENCOUNTER — Ambulatory Visit: Payer: Medicaid Other | Admitting: Physical Therapy

## 2022-07-22 ENCOUNTER — Other Ambulatory Visit (HOSPITAL_COMMUNITY)
Admission: RE | Admit: 2022-07-22 | Discharge: 2022-07-22 | Disposition: A | Discharge status: Home / Self Care | Payer: Medicaid Other | Source: Ambulatory Visit | Attending: Advanced Practice Midwife | Admitting: Advanced Practice Midwife

## 2022-07-22 VITALS — BP 132/77 | HR 88 | Wt 281.0 lb

## 2022-07-22 DIAGNOSIS — Z3480 Encounter for supervision of other normal pregnancy, unspecified trimester: Secondary | ICD-10-CM | POA: Insufficient documentation

## 2022-07-22 DIAGNOSIS — R87612 Low grade squamous intraepithelial lesion on cytologic smear of cervix (LGSIL): Secondary | ICD-10-CM

## 2022-07-22 DIAGNOSIS — Z3A36 36 weeks gestation of pregnancy: Secondary | ICD-10-CM

## 2022-07-22 DIAGNOSIS — Z3483 Encounter for supervision of other normal pregnancy, third trimester: Secondary | ICD-10-CM

## 2022-07-22 MED ORDER — PRENATAL VITAMINS 28-0.8 MG PO TABS: 1.0000 | ORAL_TABLET | Freq: Every day | ORAL | 6 refills | Status: DC

## 2022-07-22 NOTE — Progress Notes (Signed)
   PRENATAL VISIT NOTE  Subjective:  Anita Herrera is a 26 y.o. [id number] at [redacted]w[redacted]d being seen today for ongoing prenatal care.  She is currently monitored for the following issues for this low-risk pregnancy and has Supervision of other normal pregnancy, antepartum; LGSIL of cervix of undetermined significance; UTI (urinary tract infection) during pregnancy; Maternal obesity affecting pregnancy, antepartum; Dysuria during pregnancy in third trimester; and Heartburn during pregnancy in third trimester on their problem list.  Patient reports occasional contractions.  Contractions: Irritability. Vag. Bleeding: None.  Movement: Present. Denies leaking of fluid.   The following portions of the patient's history were reviewed and updated as appropriate: allergies, current medications, past family history, past medical history, past social history, past surgical history and problem list.   Objective:   Vitals:   07/22/22 0956  BP: 132/77  Pulse: 88  Weight: 281 lb (127.5 kg)    Fetal Status:   Fundal Height: 37 cm Movement: Present  Presentation: Vertex  General:  Alert, oriented and cooperative. Patient is in no acute distress.  Skin: Skin is warm and dry. No rash noted.   Cardiovascular: Normal heart rate noted  Respiratory: Normal respiratory effort, no problems with respiration noted  Abdomen: Soft, gravid, appropriate for gestational age.  Pain/Pressure: Absent     Pelvic: Cervical exam performed in the presence of a chaperone Dilation: Fingertip Effacement (%): 30 Station: Ballotable  Extremities: Normal range of motion.     Mental Status: Normal mood and affect. Normal behavior. Normal judgment and thought content.   Assessment and Plan:  Pregnancy: [id number] at [redacted]w[redacted]d 1. LGSIL of cervix of undetermined significance --Pt with abnormal Pap 2 years prior to pap on 02/21/22 --Colpo postpartum  2. Supervision of other normal pregnancy, antepartum --Anticipatory guidance about next  visits/weeks of pregnancy given.   - Culture, beta strep (group b only) - Cervicovaginal ancillary only( [place])  3. [redacted] weeks gestation of pregnancy    Term labor symptoms and general obstetric precautions including but not limited to vaginal bleeding, contractions, leaking of fluid and fetal movement were reviewed in detail with the patient. Please refer to After Visit Summary for other counseling recommendations.   Return in about 1 week (around 07/29/2022) for LOB, Any provider.  Future Appointments  Date Time Provider [place]  07/23/2022 12:30 PM [facility] NURSE [facility]  07/23/2022 12:45 PM [facility] US[facility]  07/29/2022  9:35 AM Constant, Peggy, MD [facility] None  07/30/2022 12:30 PM [facility] NURSE [facility]  07/30/2022 12:45 PM [facility] US4 [facility]  08/01/2022 12:30 PM Desenglau, Tommy Rainwater, PT [facility] None  08/05/2022  9:35 AM Leftwich-Kirby, Kathie Dike, CNM [facility] None  08/05/2022  2:45 PM Desenglau, Tommy Rainwater, PT [facility] None    Fatima Blank, CNM

## 2022-07-22 NOTE — Progress Notes (Signed)
Pt presents for ROB. GBS and GC/CC collected today.   Requests note for work to begin leave on 10/20.  PNV Rx.

## 2022-07-22 NOTE — Therapy (Deleted)
OUTPATIENT PHYSICAL THERAPY FEMALE PELVIC TREATMENT   Patient Name: Anita Herrera MRN: [id number] DOB:19-Jul-1996, 26 y.o., female Today's Date: 07/22/2022      Past Medical History:  Diagnosis Date   Anemia    Shingles    Vaginitis    No past surgical history on file. Patient Active Problem List   Diagnosis Date Noted   Dysuria during pregnancy in third trimester 06/11/2022   Heartburn during pregnancy in third trimester 06/11/2022   Maternal obesity affecting pregnancy, antepartum 05/20/2022   LGSIL of cervix of undetermined significance 02/26/2022   UTI (urinary tract infection) during pregnancy 02/26/2022   Supervision of other normal pregnancy, antepartum 02/07/2022    PCP: none  REFERRING PROVIDER: Hurshel Party, CNM  REFERRING DIAG: [phone] (ICD-10-CM) - Pelvic pain affecting pregnancy in third trimester, antepartum  THERAPY DIAG:  No diagnosis found.  Rationale for Evaluation and Treatment Rehabilitation  ONSET DATE: 1-2 months  SUBJECTIVE:                                                                                                                                                                                          Pain was there when I was in the car.  After walking a little it goes away.     EVAL SUBJECTIVE STATEMENT: Groin and lower abdominal pain began 1-2 months ago.  Pt says when lying down or sitting and then going to stand up Fluid intake: Yes: water     PAIN:  Are you having pain? Yes NPRS scale: 3/10 Pain location: lower abdomen  Pain type: aching Pain description: intermittent   Aggravating factors: standing or getting up after lying/sitting Relieving factors: getting up and moving around for a few minutes  PRECAUTIONS: Other: 33 weeks antepartum  WEIGHT BEARING RESTRICTIONS No  FALLS:  Has patient fallen in last 6 months? No  LIVING ENVIRONMENT: Lives with: lives with their family and 2  daughters Lives in: House/apartment   OCCUPATION: full time - standing and lifting  PLOF: Independent  PATIENT GOALS not have the pain  PERTINENT HISTORY:  Antepartum 33 weeks; 2 previous pregnancies Sexual abuse: No  BOWEL MOVEMENT No issues  URINATION Pain with urination: No  Leakage:  no   INTERCOURSE Pain with intercourse:  No   PREGNANCY Vaginal deliveries 2 Tearing Yes: first one   PROLAPSE None    OBJECTIVE:   DIAGNOSTIC FINDINGS:    PATIENT SURVEYS:    PFIQ-7 = 24  COGNITION:  Overall cognitive status: Within functional limits for tasks assessed     SENSATION:   MUSCLE LENGTH: Hamstrings: Thomas test:   LUMBAR SPECIAL TESTS:  FUNCTIONAL TESTS:  Single leg stand - some instability - no pain  GAIT:  Comments: wide base of support               POSTURE: decreased thoracic kyphosis and anterior pelvic tilt   PELVIC ALIGNMENT:  LUMBARAROM/PROM Not tested A/PROM A/PROM  eval  Flexion   Extension   Right lateral flexion   Left lateral flexion   Right rotation   Left rotation    (Blank rows = not tested)  LOWER EXTREMITY ROM:  Not tested due to time Passive ROM Right eval Left eval  Hip flexion    Hip extension    Hip abduction    Hip adduction    Hip internal rotation    Hip external rotation    Knee flexion    Knee extension    Ankle dorsiflexion    Ankle plantarflexion    Ankle inversion    Ankle eversion     (Blank rows = not tested)  LOWER EXTREMITY MMT:  MMT Right eval Left eval  Hip flexion 4/5 pain 4/5 pain  Hip extension    Hip abduction    Hip adduction    Hip internal rotation    Hip external rotation    Knee flexion    Knee extension    Ankle dorsiflexion    Ankle plantarflexion    Ankle inversion    Ankle eversion      PALPATION:   General  lumbar paraspinals tight; pelvic posterior tilt increased pain                External Perineal Exam normal but little movement with contract  and relax                             Internal Pelvic Floor 4/5 MMT bil; tender to palpation  Patient confirms identification and approves PT to assess internal pelvic floor and treatment Yes No emotional/communication barriers or cognitive limitation. Patient is motivated to learn. Patient understands and agrees with treatment goals and plan. PT explains patient will be examined in standing, sitting, and lying down to see how their muscles and joints work. When they are ready, they will be asked to remove their underwear so PT can examine their perineum. The patient is also given the option of providing their own chaperone as one is not provided in our facility. The patient also has the right and is explained the right to defer or refuse any part of the evaluation or treatment including the internal exam. With the patient's consent, PT will use one gloved finger to gently assess the muscles of the pelvic floor, seeing how well it contracts and relaxes and if there is muscle symmetry. After, the patient will get dressed and PT and patient will discuss exam findings and plan of care. PT and patient discuss plan of care, schedule, attendance policy and HEP activities.  PELVIC MMT:   MMT eval  Vaginal 4/5 x 3 sec  Internal Anal Sphincter   External Anal Sphincter   Puborectalis   Diastasis Recti   (Blank rows = not tested)        TONE: High Rt>Lt  PROLAPSE: no  TODAY'S TREATMENT  Date: 07/08/22 Self care: Tennis ball massage against the wall glutes and lumbar  Nuero Re-ed: Education and cues for coordination of breathing  Transversus abdominus activation  Exercises: Hip flexor stretch - 3 x 30 sec bil Mini lunge -  10x bil Quadruped UE alternating - 10x  Manual:  Round ligament release Rt side from pubis to labia and lateral abdominal fascial release   PATIENT EDUCATION:  Education details: Access Code: [email or web address] Person educated: Patient Education method: Consulting civil engineer,  Demonstration, Verbal cues, and Handouts Education comprehension: verbalized understanding and returned demonstration   HOME EXERCISE PROGRAM: Access Code: [email or web address] URL: [email or web address] Date: 07/08/2022 Prepared by: Jari Favre  Exercises - Down dog on table or chair  - 3 x daily - 7 x weekly - 1 sets - 8 reps - Sidelying Transversus Abdominis Bracing  - 1 x daily - 7 x weekly - 3 sets - 10 reps - Reverse Lunge with Dowel Rod Across Shoulders  - 1 x daily - 7 x weekly - 3 sets - 10 reps - Seated Hip Flexor Stretch  - 1 x daily - 7 x weekly - 3 sets - 10 reps - Seated Hip Flexor Stretch on Swiss Ball  - 1 x daily - 7 x weekly - 3 sets - 10 reps - Quadruped Weight-Bearing Activity: Reaching  - 1 x daily - 7 x weekly - 3 sets - 10 reps  ASSESSMENT:  CLINICAL IMPRESSION: Today's session focused on pain management with STM and fascial release. Following STM pt was given stretches and tennis ball massage so she can maintain increased sof tissue length at home.  Pt demonstrates pregnancy related postural changes and decreased abdominal and gluteal activity with overactive hip flexors and quads.  Stretches and exercises to build on improved posture were given today.  Pt will need skilled PT to continue to address these impairments for improved function during pregnancy.  OBJECTIVE IMPAIRMENTS decreased activity tolerance, decreased coordination, decreased ROM, decreased strength, increased muscle spasms, impaired flexibility, postural dysfunction, and pain.   ACTIVITY LIMITATIONS sitting, sleeping, and transfers  PARTICIPATION LIMITATIONS: community activity and sitting activities  PERSONAL FACTORS 1-2 comorbidities: 2 vaginal deliveries; 33 weeks antepartum  are also affecting patient's functional outcome.   REHAB POTENTIAL: Excellent  CLINICAL DECISION MAKING: Evolving/moderate complexity  EVALUATION COMPLEXITY: Low   GOALS: Goals reviewed with patient?  Yes  SHORT TERM GOALS: Target date: 07/30/2022  Ind with initial HEP Baseline: Goal status: MET    LONG TERM GOALS: Target date: 08/27/2022   Pt will be independent with advanced HEP to maintain improvements made throughout therapy  Baseline:  Goal status: INITIAL  2.  Pt will report 80% reduction of pain due to improvements in posture, strength, and muscle length  Baseline:  Goal status: INITIAL  3.  Pt will be able to sit for at least 2 hours without pain when standing Baseline:  Goal status: INITIAL  4.  Pt will be able to perform bed mobility correctly without pain Baseline:  Goal status: INITIAL   PLAN: PT FREQUENCY: 1x/week  PT DURATION: 12 weeks  PLANNED INTERVENTIONS: Therapeutic exercises, Therapeutic activity, Neuromuscular re-education, Balance training, Gait training, Patient/Family education, Self Care, Joint mobilization, Aquatic Therapy, Dry Needling, Electrical stimulation, Cryotherapy, Moist heat, Taping, Biofeedback, Manual therapy, and Re-evaluation  PLAN FOR NEXT SESSION: f/u on added exercises; hip flexor and quad stretch and STM; abdominal fascial release; gluteal activation and transversus abdominus activation tall and half kneel as able   [employer], PT 07/22/2022, 8:17 AM

## 2022-07-23 ENCOUNTER — Ambulatory Visit: Payer: Medicaid Other | Attending: Obstetrics and Gynecology

## 2022-07-23 ENCOUNTER — Encounter: Payer: Self-pay | Admitting: *Deleted

## 2022-07-23 ENCOUNTER — Ambulatory Visit: Payer: Medicaid Other | Admitting: *Deleted

## 2022-07-23 VITALS — BP 135/64 | HR 65

## 2022-07-23 DIAGNOSIS — O99210 Obesity complicating pregnancy, unspecified trimester: Secondary | ICD-10-CM | POA: Insufficient documentation

## 2022-07-23 DIAGNOSIS — Z3480 Encounter for supervision of other normal pregnancy, unspecified trimester: Secondary | ICD-10-CM | POA: Insufficient documentation

## 2022-07-23 DIAGNOSIS — O3663X0 Maternal care for excessive fetal growth, third trimester, not applicable or unspecified: Secondary | ICD-10-CM | POA: Diagnosis not present

## 2022-07-23 DIAGNOSIS — O99213 Obesity complicating pregnancy, third trimester: Secondary | ICD-10-CM | POA: Insufficient documentation

## 2022-07-23 DIAGNOSIS — E669 Obesity, unspecified: Secondary | ICD-10-CM | POA: Diagnosis not present

## 2022-07-23 DIAGNOSIS — Z3A36 36 weeks gestation of pregnancy: Secondary | ICD-10-CM | POA: Insufficient documentation

## 2022-07-23 LAB — CERVICOVAGINAL ANCILLARY ONLY
Chlamydia: NEGATIVE
Comment: NEGATIVE
Comment: NORMAL
Neisseria Gonorrhea: NEGATIVE

## 2022-07-26 LAB — CULTURE, BETA STREP (GROUP B ONLY): Strep Gp B Culture: POSITIVE — AB

## 2022-07-29 ENCOUNTER — Encounter: Payer: Self-pay | Admitting: Advanced Practice Midwife

## 2022-07-29 ENCOUNTER — Ambulatory Visit (INDEPENDENT_AMBULATORY_CARE_PROVIDER_SITE_OTHER): Payer: Medicaid Other | Admitting: Advanced Practice Midwife

## 2022-07-29 VITALS — BP 134/78 | HR 86 | Wt 286.0 lb

## 2022-07-29 DIAGNOSIS — Z3A37 37 weeks gestation of pregnancy: Secondary | ICD-10-CM

## 2022-07-29 DIAGNOSIS — O99210 Obesity complicating pregnancy, unspecified trimester: Secondary | ICD-10-CM

## 2022-07-29 DIAGNOSIS — Z3480 Encounter for supervision of other normal pregnancy, unspecified trimester: Secondary | ICD-10-CM

## 2022-07-29 DIAGNOSIS — O99213 Obesity complicating pregnancy, third trimester: Secondary | ICD-10-CM

## 2022-07-29 NOTE — Progress Notes (Signed)
Patient presents for ROB visit. No concerns at this time.   

## 2022-07-29 NOTE — Progress Notes (Signed)
   PRENATAL VISIT NOTE  Subjective:  Anita Herrera is a 26 y.o. [phone] at [redacted]w[redacted]d being seen today for ongoing prenatal care.  She is currently monitored for the following issues for this low-risk pregnancy and has Supervision of other normal pregnancy, antepartum; LGSIL of cervix of undetermined significance; UTI (urinary tract infection) during pregnancy; Maternal obesity affecting pregnancy, antepartum; Dysuria during pregnancy in third trimester; and Heartburn during pregnancy in third trimester on their problem list.  Patient reports occasional contractions.  Contractions: Not present. Vag. Bleeding: None.  Movement: Present. Denies leaking of fluid.   The following portions of the patient's history were reviewed and updated as appropriate: allergies, current medications, past family history, past medical history, past social history, past surgical history and problem list.   Objective:   Vitals:   07/29/22 0955  BP: 134/78  Pulse: 86  Weight: 286 lb (129.7 kg)    Fetal Status: Fetal Heart Rate (bpm): 146   Movement: Present     General:  Alert, oriented and cooperative. Patient is in no acute distress.  Skin: Skin is warm and dry. No rash noted.   Cardiovascular: Normal heart rate noted  Respiratory: Normal respiratory effort, no problems with respiration noted  Abdomen: Soft, gravid, appropriate for gestational age.  Pain/Pressure: Absent     Pelvic: Cervical exam deferred        Extremities: Normal range of motion.  Edema: None  Mental Status: Normal mood and affect. Normal behavior. Normal judgment and thought content.   Assessment and Plan:  Pregnancy: [id number] at [redacted]w[redacted]d 1. Supervision of other normal pregnancy, antepartum --Anticipatory guidance about next visits/weeks of pregnancy given.  --Discussed pt plans for leave from work. Pt had discussed previously with provider that her mostly standing job is difficult due to the pregnancy.   --Pt has discussed with her  manager and plans to use short term disability now and be out until delivery. She is Ok if some of her time after baby has to be unpaid. She is working with her Freight forwarder about return dates to work. --Short term disability paperwork and letter of medical need for leave provided today.   2. Obesity affecting pregnancy, antepartum, unspecified obesity type   3. [redacted] weeks gestation of pregnancy   Term labor symptoms and general obstetric precautions including but not limited to vaginal bleeding, contractions, leaking of fluid and fetal movement were reviewed in detail with the patient. Please refer to After Visit Summary for other counseling recommendations.   No follow-ups on file.  Future Appointments  Date Time Provider [place]  07/30/2022 12:30 PM [facility] NURSE [facility]  07/30/2022 12:45 PM [facility] US4 [facility]  08/01/2022 12:30 PM Desenglau, Tommy Rainwater, PT [facility] None  08/05/2022  9:35 AM Leftwich-Kirby, Kathie Dike, CNM [facility] None  08/05/2022  2:45 PM Desenglau, Tommy Rainwater, PT [facility] None    Fatima Blank, CNM

## 2022-07-30 ENCOUNTER — Ambulatory Visit: Payer: Medicaid Other | Attending: Obstetrics and Gynecology

## 2022-07-30 ENCOUNTER — Ambulatory Visit: Payer: Medicaid Other

## 2022-07-30 VITALS — BP 126/66 | HR 92

## 2022-07-30 DIAGNOSIS — O99210 Obesity complicating pregnancy, unspecified trimester: Secondary | ICD-10-CM | POA: Insufficient documentation

## 2022-07-30 DIAGNOSIS — O99213 Obesity complicating pregnancy, third trimester: Secondary | ICD-10-CM | POA: Insufficient documentation

## 2022-07-30 DIAGNOSIS — Z3A37 37 weeks gestation of pregnancy: Secondary | ICD-10-CM | POA: Diagnosis not present

## 2022-07-30 DIAGNOSIS — Z3480 Encounter for supervision of other normal pregnancy, unspecified trimester: Secondary | ICD-10-CM

## 2022-07-31 ENCOUNTER — Other Ambulatory Visit: Payer: Self-pay | Admitting: *Deleted

## 2022-07-31 DIAGNOSIS — O99213 Obesity complicating pregnancy, third trimester: Secondary | ICD-10-CM

## 2022-07-31 NOTE — Therapy (Deleted)
OUTPATIENT PHYSICAL THERAPY FEMALE PELVIC TREATMENT   Patient Name: Anita Herrera MRN: [id number] DOB:02/13/1996, 26 y.o., female Today's Date: 07/31/2022      Past Medical History:  Diagnosis Date   Anemia    Shingles    Vaginitis    No past surgical history on file. Patient Active Problem List   Diagnosis Date Noted   Dysuria during pregnancy in third trimester 06/11/2022   Heartburn during pregnancy in third trimester 06/11/2022   Maternal obesity affecting pregnancy, antepartum 05/20/2022   LGSIL of cervix of undetermined significance 02/26/2022   UTI (urinary tract infection) during pregnancy 02/26/2022   Supervision of other normal pregnancy, antepartum 02/07/2022    PCP: none  REFERRING PROVIDER: Elvera Maria, CNM  REFERRING DIAG: [phone] (ICD-10-CM) - Pelvic pain affecting pregnancy in third trimester, antepartum  THERAPY DIAG:  No diagnosis found.  Rationale for Evaluation and Treatment Rehabilitation  ONSET DATE: 1-2 months  SUBJECTIVE:                                                                                                                                                                                          Pain was there when I was in the car.  After walking a little it goes away.     EVAL SUBJECTIVE STATEMENT: Groin and lower abdominal pain began 1-2 months ago.  Pt says when lying down or sitting and then going to stand up Fluid intake: Yes: water     PAIN:  Are you having pain? Yes NPRS scale: 3/10 Pain location: lower abdomen  Pain type: aching Pain description: intermittent   Aggravating factors: standing or getting up after lying/sitting Relieving factors: getting up and moving around for a few minutes  PRECAUTIONS: Other: 33 weeks antepartum  WEIGHT BEARING RESTRICTIONS No  FALLS:  Has patient fallen in last 6 months? No  LIVING ENVIRONMENT: Lives with: lives with their family and 2  daughters Lives in: House/apartment   OCCUPATION: full time - standing and lifting  PLOF: Independent  PATIENT GOALS not have the pain  PERTINENT HISTORY:  Antepartum 33 weeks; 2 previous pregnancies Sexual abuse: No  BOWEL MOVEMENT No issues  URINATION Pain with urination: No  Leakage:  no   INTERCOURSE Pain with intercourse:  No   PREGNANCY Vaginal deliveries 2 Tearing Yes: first one   PROLAPSE None    OBJECTIVE:   DIAGNOSTIC FINDINGS:    PATIENT SURVEYS:    PFIQ-7 = 24  COGNITION:  Overall cognitive status: Within functional limits for tasks assessed     SENSATION:   MUSCLE LENGTH: Hamstrings: Thomas test:   LUMBAR SPECIAL TESTS:  FUNCTIONAL TESTS:  Single leg stand - some instability - no pain  GAIT:  Comments: wide base of support               POSTURE: decreased thoracic kyphosis and anterior pelvic tilt   PELVIC ALIGNMENT:  LUMBARAROM/PROM Not tested A/PROM A/PROM  eval  Flexion   Extension   Right lateral flexion   Left lateral flexion   Right rotation   Left rotation    (Blank rows = not tested)  LOWER EXTREMITY ROM:  Not tested due to time Passive ROM Right eval Left eval  Hip flexion    Hip extension    Hip abduction    Hip adduction    Hip internal rotation    Hip external rotation    Knee flexion    Knee extension    Ankle dorsiflexion    Ankle plantarflexion    Ankle inversion    Ankle eversion     (Blank rows = not tested)  LOWER EXTREMITY MMT:  MMT Right eval Left eval  Hip flexion 4/5 pain 4/5 pain  Hip extension    Hip abduction    Hip adduction    Hip internal rotation    Hip external rotation    Knee flexion    Knee extension    Ankle dorsiflexion    Ankle plantarflexion    Ankle inversion    Ankle eversion      PALPATION:   General  lumbar paraspinals tight; pelvic posterior tilt increased pain                External Perineal Exam normal but little movement with contract  and relax                             Internal Pelvic Floor 4/5 MMT bil; tender to palpation  Patient confirms identification and approves PT to assess internal pelvic floor and treatment Yes No emotional/communication barriers or cognitive limitation. Patient is motivated to learn. Patient understands and agrees with treatment goals and plan. PT explains patient will be examined in standing, sitting, and lying down to see how their muscles and joints work. When they are ready, they will be asked to remove their underwear so PT can examine their perineum. The patient is also given the option of providing their own chaperone as one is not provided in our facility. The patient also has the right and is explained the right to defer or refuse any part of the evaluation or treatment including the internal exam. With the patient's consent, PT will use one gloved finger to gently assess the muscles of the pelvic floor, seeing how well it contracts and relaxes and if there is muscle symmetry. After, the patient will get dressed and PT and patient will discuss exam findings and plan of care. PT and patient discuss plan of care, schedule, attendance policy and HEP activities.  PELVIC MMT:   MMT eval  Vaginal 4/5 x 3 sec  Internal Anal Sphincter   External Anal Sphincter   Puborectalis   Diastasis Recti   (Blank rows = not tested)        TONE: High Rt>Lt  PROLAPSE: no  TODAY'S TREATMENT  Date: 07/08/22 Self care: Tennis ball massage against the wall glutes and lumbar  Nuero Re-ed: Education and cues for coordination of breathing  Transversus abdominus activation  Exercises: Hip flexor stretch - 3 x 30 sec bil Mini lunge -  10x bil Quadruped UE alternating - 10x  Manual:  Round ligament release Rt side from pubis to labia and lateral abdominal fascial release   PATIENT EDUCATION:  Education details: Access Code: [email or web address] Person educated: Patient Education method: Consulting civil engineer,  Demonstration, Verbal cues, and Handouts Education comprehension: verbalized understanding and returned demonstration   HOME EXERCISE PROGRAM: Access Code: [email or web address] URL: [email or web address] Date: 07/08/2022 Prepared by: Jari Favre  Exercises - Down dog on table or chair  - 3 x daily - 7 x weekly - 1 sets - 8 reps - Sidelying Transversus Abdominis Bracing  - 1 x daily - 7 x weekly - 3 sets - 10 reps - Reverse Lunge with Dowel Rod Across Shoulders  - 1 x daily - 7 x weekly - 3 sets - 10 reps - Seated Hip Flexor Stretch  - 1 x daily - 7 x weekly - 3 sets - 10 reps - Seated Hip Flexor Stretch on Swiss Ball  - 1 x daily - 7 x weekly - 3 sets - 10 reps - Quadruped Weight-Bearing Activity: Reaching  - 1 x daily - 7 x weekly - 3 sets - 10 reps  ASSESSMENT:  CLINICAL IMPRESSION: Today's session focused on pain management with STM and fascial release. Following STM pt was given stretches and tennis ball massage so she can maintain increased sof tissue length at home.  Pt demonstrates pregnancy related postural changes and decreased abdominal and gluteal activity with overactive hip flexors and quads.  Stretches and exercises to build on improved posture were given today.  Pt will need skilled PT to continue to address these impairments for improved function during pregnancy.  OBJECTIVE IMPAIRMENTS decreased activity tolerance, decreased coordination, decreased ROM, decreased strength, increased muscle spasms, impaired flexibility, postural dysfunction, and pain.   ACTIVITY LIMITATIONS sitting, sleeping, and transfers  PARTICIPATION LIMITATIONS: community activity and sitting activities  PERSONAL FACTORS 1-2 comorbidities: 2 vaginal deliveries; 33 weeks antepartum  are also affecting patient's functional outcome.   REHAB POTENTIAL: Excellent  CLINICAL DECISION MAKING: Evolving/moderate complexity  EVALUATION COMPLEXITY: Low   GOALS: Goals reviewed with patient?  Yes  SHORT TERM GOALS: Target date: 07/30/2022  Ind with initial HEP Baseline: Goal status: MET    LONG TERM GOALS: Target date: 08/27/2022   Pt will be independent with advanced HEP to maintain improvements made throughout therapy  Baseline:  Goal status: INITIAL  2.  Pt will report 80% reduction of pain due to improvements in posture, strength, and muscle length  Baseline:  Goal status: INITIAL  3.  Pt will be able to sit for at least 2 hours without pain when standing Baseline:  Goal status: INITIAL  4.  Pt will be able to perform bed mobility correctly without pain Baseline:  Goal status: INITIAL   PLAN: PT FREQUENCY: 1x/week  PT DURATION: 12 weeks  PLANNED INTERVENTIONS: Therapeutic exercises, Therapeutic activity, Neuromuscular re-education, Balance training, Gait training, Patient/Family education, Self Care, Joint mobilization, Aquatic Therapy, Dry Needling, Electrical stimulation, Cryotherapy, Moist heat, Taping, Biofeedback, Manual therapy, and Re-evaluation  PLAN FOR NEXT SESSION: f/u on added exercises; hip flexor and quad stretch and STM; abdominal fascial release; gluteal activation and transversus abdominus activation tall and half kneel as able   [employer], PT 07/31/2022, 1:55 PM

## 2022-08-01 ENCOUNTER — Ambulatory Visit: Payer: Medicaid Other | Admitting: Physical Therapy

## 2022-08-05 ENCOUNTER — Ambulatory Visit (INDEPENDENT_AMBULATORY_CARE_PROVIDER_SITE_OTHER): Payer: Medicaid Other | Admitting: Advanced Practice Midwife

## 2022-08-05 ENCOUNTER — Ambulatory Visit: Payer: Medicaid Other | Admitting: Physical Therapy

## 2022-08-05 ENCOUNTER — Other Ambulatory Visit: Payer: Self-pay | Admitting: Obstetrics and Gynecology

## 2022-08-05 VITALS — BP 134/77 | HR 84 | Wt 280.0 lb

## 2022-08-05 DIAGNOSIS — Z3483 Encounter for supervision of other normal pregnancy, third trimester: Secondary | ICD-10-CM

## 2022-08-05 DIAGNOSIS — O99213 Obesity complicating pregnancy, third trimester: Secondary | ICD-10-CM

## 2022-08-05 DIAGNOSIS — Z3480 Encounter for supervision of other normal pregnancy, unspecified trimester: Secondary | ICD-10-CM

## 2022-08-05 DIAGNOSIS — Z8759 Personal history of other complications of pregnancy, childbirth and the puerperium: Secondary | ICD-10-CM | POA: Insufficient documentation

## 2022-08-05 DIAGNOSIS — Z3A38 38 weeks gestation of pregnancy: Secondary | ICD-10-CM | POA: Diagnosis not present

## 2022-08-05 DIAGNOSIS — O3663X0 Maternal care for excessive fetal growth, third trimester, not applicable or unspecified: Secondary | ICD-10-CM

## 2022-08-05 DIAGNOSIS — O3660X0 Maternal care for excessive fetal growth, unspecified trimester, not applicable or unspecified: Secondary | ICD-10-CM

## 2022-08-05 DIAGNOSIS — O26893 Other specified pregnancy related conditions, third trimester: Secondary | ICD-10-CM

## 2022-08-05 DIAGNOSIS — O99210 Obesity complicating pregnancy, unspecified trimester: Secondary | ICD-10-CM

## 2022-08-05 NOTE — Patient Instructions (Signed)
Labor Precautions Reasons to come to MAU at [facility]:  1.  Contractions are  5 minutes apart or less, each last 1 minute, these have been going on for 1-2 hours, and you cannot walk or talk during them 2.  You have a large gush of fluid, or a trickle of fluid that will not stop and you have to wear a pad 3.  You have bleeding that is bright red, heavier than spotting--like menstrual bleeding (spotting can be normal in early labor or after a check of your cervix) 4.  You do not feel the baby moving like he/she normally does  

## 2022-08-05 NOTE — Progress Notes (Signed)
   PRENATAL VISIT NOTE  Subjective:  Anita Herrera is a 26 y.o. [id number] at [redacted]w[redacted]d being seen today for ongoing prenatal care.  She is currently monitored for the following issues for this low-risk pregnancy and has Supervision of other normal pregnancy, antepartum; LGSIL of cervix of undetermined significance; UTI (urinary tract infection) during pregnancy; Maternal obesity affecting pregnancy, antepartum; Dysuria during pregnancy in third trimester; and Heartburn during pregnancy in third trimester on their problem list.  Patient reports no complaints.  Contractions: Irritability. Vag. Bleeding: None.  Movement: Present. Denies leaking of fluid.   The following portions of the patient's history were reviewed and updated as appropriate: allergies, current medications, past family history, past medical history, past social history, past surgical history and problem list.   Objective:   Vitals:   08/05/22 0915  BP: 134/77  Pulse: 84  Weight: 280 lb (127 kg)    Fetal Status: Fetal Heart Rate (bpm): 138 Fundal Height: 40 cm Movement: Present     General:  Alert, oriented and cooperative. Patient is in no acute distress.  Skin: Skin is warm and dry. No rash noted.   Cardiovascular: Normal heart rate noted  Respiratory: Normal respiratory effort, no problems with respiration noted  Abdomen: Soft, gravid, appropriate for gestational age.  Pain/Pressure: Present     Pelvic: Cervical exam deferred        Extremities: Normal range of motion.  Edema: None  Mental Status: Normal mood and affect. Normal behavior. Normal judgment and thought content.   Assessment and Plan:  Pregnancy: G2X5284 at [redacted]w[redacted]d   1. Supervision of other normal pregnancy, antepartum --Anticipatory guidance about next visits/weeks of pregnancy given.     2. Obesity affecting pregnancy, antepartum, unspecified obesity type   3. [redacted] weeks gestation of pregnancy   4. Excessive fetal growth affecting management of  pregnancy, antepartum, single or unspecified fetus --Hx macrosomia with shoulder dystocia  --MFM recommends IOL at 39 weeks, pt desires --OP foley insertion scheduled for 11/7 and IOL for 11/8 --Orders placed  Term labor symptoms and general obstetric precautions including but not limited to vaginal bleeding, contractions, leaking of fluid and fetal movement were reviewed in detail with the patient. Please refer to After Visit Summary for other counseling recommendations.   Return in about 1 week (around 08/12/2022) for Foley bulb insertion on 11/7 and induction of labor on 11/8.  Future Appointments  Date Time Provider [place]  08/06/2022  2:45 PM [facility] NURSE [facility]  08/06/2022  3:00 PM [facility] US[facility]  08/13/2022  1:30 PM [place]-Kirby, Kathie Dike, CNM [facility] None    Fatima Blank, CNM

## 2022-08-06 ENCOUNTER — Ambulatory Visit: Payer: Medicaid Other | Attending: Maternal & Fetal Medicine | Admitting: Maternal & Fetal Medicine

## 2022-08-06 ENCOUNTER — Inpatient Hospital Stay (HOSPITAL_COMMUNITY): Payer: Medicaid Other | Admitting: Anesthesiology

## 2022-08-06 ENCOUNTER — Encounter: Payer: Self-pay | Admitting: *Deleted

## 2022-08-06 ENCOUNTER — Ambulatory Visit: Payer: Medicaid Other | Admitting: *Deleted

## 2022-08-06 ENCOUNTER — Ambulatory Visit (HOSPITAL_BASED_OUTPATIENT_CLINIC_OR_DEPARTMENT_OTHER): Payer: Medicaid Other

## 2022-08-06 ENCOUNTER — Encounter (HOSPITAL_COMMUNITY): Payer: Self-pay | Admitting: Obstetrics and Gynecology

## 2022-08-06 ENCOUNTER — Inpatient Hospital Stay (HOSPITAL_COMMUNITY)
Admission: AD | Admit: 2022-08-06 | Discharge: 2022-08-08 | DRG: 807 | Disposition: A | Discharge status: Home / Self Care | Payer: Medicaid Other | Attending: Obstetrics and Gynecology | Admitting: Obstetrics and Gynecology

## 2022-08-06 VITALS — BP 139/73 | HR 84

## 2022-08-06 DIAGNOSIS — O99210 Obesity complicating pregnancy, unspecified trimester: Secondary | ICD-10-CM

## 2022-08-06 DIAGNOSIS — Z8759 Personal history of other complications of pregnancy, childbirth and the puerperium: Secondary | ICD-10-CM

## 2022-08-06 DIAGNOSIS — Z362 Encounter for other antenatal screening follow-up: Secondary | ICD-10-CM

## 2022-08-06 DIAGNOSIS — Z3A38 38 weeks gestation of pregnancy: Secondary | ICD-10-CM | POA: Diagnosis not present

## 2022-08-06 DIAGNOSIS — O289 Unspecified abnormal findings on antenatal screening of mother: Secondary | ICD-10-CM | POA: Diagnosis not present

## 2022-08-06 DIAGNOSIS — E669 Obesity, unspecified: Secondary | ICD-10-CM | POA: Diagnosis not present

## 2022-08-06 DIAGNOSIS — O99213 Obesity complicating pregnancy, third trimester: Secondary | ICD-10-CM

## 2022-08-06 DIAGNOSIS — Z87891 Personal history of nicotine dependence: Secondary | ICD-10-CM

## 2022-08-06 DIAGNOSIS — O99824 Streptococcus B carrier state complicating childbirth: Secondary | ICD-10-CM | POA: Diagnosis present

## 2022-08-06 DIAGNOSIS — Z3480 Encounter for supervision of other normal pregnancy, unspecified trimester: Secondary | ICD-10-CM

## 2022-08-06 DIAGNOSIS — O99214 Obesity complicating childbirth: Secondary | ICD-10-CM | POA: Diagnosis present

## 2022-08-06 DIAGNOSIS — O26893 Other specified pregnancy related conditions, third trimester: Secondary | ICD-10-CM | POA: Diagnosis present

## 2022-08-06 DIAGNOSIS — O3660X0 Maternal care for excessive fetal growth, unspecified trimester, not applicable or unspecified: Secondary | ICD-10-CM

## 2022-08-06 DIAGNOSIS — O3663X0 Maternal care for excessive fetal growth, third trimester, not applicable or unspecified: Principal | ICD-10-CM

## 2022-08-06 DIAGNOSIS — O3660X1 Maternal care for excessive fetal growth, unspecified trimester, fetus 1: Secondary | ICD-10-CM | POA: Diagnosis not present

## 2022-08-06 LAB — CBC
HCT: 31.6 % — ABNORMAL LOW (ref 36.0–46.0)
Hemoglobin: 10.4 g/dL — ABNORMAL LOW (ref 12.0–15.0)
MCH: 30.1 pg (ref 26.0–34.0)
MCHC: 32.9 g/dL (ref 30.0–36.0)
MCV: 91.3 fL (ref 80.0–100.0)
Platelets: 225 10*3/uL (ref 150–400)
RBC: 3.46 MIL/uL — ABNORMAL LOW (ref 3.87–5.11)
RDW: 13.4 % (ref 11.5–15.5)
WBC: 8.2 10*3/uL (ref 4.0–10.5)
nRBC: 0 % (ref 0.0–0.2)

## 2022-08-06 MED ORDER — OXYTOCIN-SODIUM CHLORIDE 30-0.9 UT/500ML-% IV SOLN
1.0000 m[IU]/min | INTRAVENOUS | Status: DC
  Filled 2022-08-06: qty 500

## 2022-08-06 MED ORDER — FENTANYL-BUPIVACAINE-NACL 0.5-0.125-0.9 MG/250ML-% EP SOLN
12.0000 mL/h | EPIDURAL | Status: DC | PRN
  Administered 2022-08-06: 12 mL/h via EPIDURAL
  Filled 2022-08-06: qty 250

## 2022-08-06 MED ORDER — TERBUTALINE SULFATE 1 MG/ML IJ SOLN: 0.2500 mg | Freq: Once | INTRAMUSCULAR | Status: DC | PRN

## 2022-08-06 MED ORDER — OXYTOCIN BOLUS FROM INFUSION
333.0000 mL | Freq: Once | INTRAVENOUS | Status: AC
  Administered 2022-08-07: 333 mL via INTRAVENOUS

## 2022-08-06 MED ORDER — LACTATED RINGERS IV SOLN
500.0000 mL | Freq: Once | INTRAVENOUS | Status: AC
  Administered 2022-08-06: 500 mL via INTRAVENOUS

## 2022-08-06 MED ORDER — LIDOCAINE-EPINEPHRINE (PF) 2 %-1:200000 IJ SOLN
INTRAMUSCULAR | Status: DC | PRN
  Administered 2022-08-06: 5 mL via EPIDURAL

## 2022-08-06 MED ORDER — DIPHENHYDRAMINE HCL 50 MG/ML IJ SOLN: 12.5000 mg | INTRAMUSCULAR | Status: DC | PRN

## 2022-08-06 MED ORDER — SODIUM CHLORIDE 0.9 % IV SOLN
5.0000 10*6.[IU] | Freq: Once | INTRAVENOUS | Status: AC
  Administered 2022-08-06: 5 10*6.[IU] via INTRAVENOUS
  Filled 2022-08-06: qty 5

## 2022-08-06 MED ORDER — LIDOCAINE HCL (PF) 1 % IJ SOLN: 30.0000 mL | INTRAMUSCULAR | Status: DC | PRN

## 2022-08-06 MED ORDER — PHENYLEPHRINE 80 MCG/ML (10ML) SYRINGE FOR IV PUSH (FOR BLOOD PRESSURE SUPPORT): 80.0000 ug | PREFILLED_SYRINGE | INTRAVENOUS | Status: DC | PRN

## 2022-08-06 MED ORDER — MISOPROSTOL 50MCG HALF TABLET
50.0000 ug | ORAL_TABLET | Freq: Once | ORAL | Status: AC
  Administered 2022-08-06: 50 ug via ORAL
  Filled 2022-08-06: qty 1

## 2022-08-06 MED ORDER — MISOPROSTOL 25 MCG QUARTER TABLET
25.0000 ug | ORAL_TABLET | Freq: Once | ORAL | Status: AC
  Administered 2022-08-06: 25 ug via VAGINAL
  Filled 2022-08-06: qty 1

## 2022-08-06 MED ORDER — ACETAMINOPHEN 325 MG PO TABS: 650.0000 mg | ORAL_TABLET | ORAL | Status: DC | PRN

## 2022-08-06 MED ORDER — ONDANSETRON HCL 4 MG/2ML IJ SOLN: 4.0000 mg | Freq: Four times a day (QID) | INTRAMUSCULAR | Status: DC | PRN

## 2022-08-06 MED ORDER — LACTATED RINGERS IV SOLN: INTRAVENOUS | Status: DC

## 2022-08-06 MED ORDER — OXYTOCIN-SODIUM CHLORIDE 30-0.9 UT/500ML-% IV SOLN
2.5000 [IU]/h | INTRAVENOUS | Status: DC
  Administered 2022-08-07: 2.5 [IU]/h via INTRAVENOUS

## 2022-08-06 MED ORDER — LACTATED RINGERS IV SOLN
500.0000 mL | INTRAVENOUS | Status: DC | PRN
  Administered 2022-08-07: 500 mL via INTRAVENOUS

## 2022-08-06 MED ORDER — PENICILLIN G POT IN DEXTROSE 60000 UNIT/ML IV SOLN
3.0000 10*6.[IU] | INTRAVENOUS | Status: DC
  Administered 2022-08-06 – 2022-08-07 (×2): 3 10*6.[IU] via INTRAVENOUS
  Filled 2022-08-06 (×2): qty 50

## 2022-08-06 MED ORDER — OXYCODONE-ACETAMINOPHEN 5-325 MG PO TABS: 2.0000 | ORAL_TABLET | ORAL | Status: DC | PRN

## 2022-08-06 MED ORDER — EPHEDRINE 5 MG/ML INJ: 10.0000 mg | INTRAVENOUS | Status: DC | PRN

## 2022-08-06 MED ORDER — SOD CITRATE-CITRIC ACID 500-334 MG/5ML PO SOLN: 30.0000 mL | ORAL | Status: DC | PRN

## 2022-08-06 MED ORDER — OXYCODONE-ACETAMINOPHEN 5-325 MG PO TABS: 1.0000 | ORAL_TABLET | ORAL | Status: DC | PRN

## 2022-08-06 MED ORDER — OXYTOCIN-SODIUM CHLORIDE 30-0.9 UT/500ML-% IV SOLN: 1.0000 m[IU]/min | INTRAVENOUS | Status: DC

## 2022-08-06 NOTE — Anesthesia Preprocedure Evaluation (Signed)
Anesthesia Evaluation  Patient identified by MRN, date of birth, ID band Patient awake    Reviewed: Allergy & Precautions, NPO status , Patient's Chart, lab work & pertinent test results  Airway Mallampati: III  TM Distance: >3 FB Neck ROM: Full    Dental no notable dental hx.    Pulmonary neg pulmonary ROS, former smoker,    Pulmonary exam normal breath sounds clear to auscultation       Cardiovascular negative cardio ROS Normal cardiovascular exam Rhythm:Regular Rate:Normal     Neuro/Psych negative neurological ROS  negative psych ROS   GI/Hepatic negative GI ROS, Neg liver ROS,   Endo/Other  Morbid obesity (BMI 45)  Renal/GU negative Renal ROS  negative genitourinary   Musculoskeletal negative musculoskeletal ROS (+)   Abdominal   Peds  Hematology  (+) Blood dyscrasia, anemia ,   Anesthesia Other Findings IOL in the setting of 4/8 BPP  Reproductive/Obstetrics (+) Pregnancy                             Anesthesia Physical Anesthesia Plan  ASA: 3  Anesthesia Plan: Epidural   Post-op Pain Management:    Induction:   PONV Risk Score and Plan: Treatment may vary due to age or medical condition  Airway Management Planned: Natural Airway  Additional Equipment:   Intra-op Plan:   Post-operative Plan:   Informed Consent: I have reviewed the patients History and Physical, chart, labs and discussed the procedure including the risks, benefits and alternatives for the proposed anesthesia with the patient or authorized representative who has indicated his/her understanding and acceptance.       Plan Discussed with: Anesthesiologist  Anesthesia Plan Comments: (Patient identified. Risks, benefits, options discussed with patient including but not limited to bleeding, infection, nerve damage, paralysis, failed block, incomplete pain control, headache, blood pressure changes, nausea,  vomiting, reactions to medication, itching, and post partum back pain. Confirmed with bedside nurse the patient's most recent platelet count. Confirmed with the patient that they are not taking any anticoagulation, have any bleeding history or any family history of bleeding disorders. Patient expressed understanding and wishes to proceed. All questions were answered. )        Anesthesia Quick Evaluation

## 2022-08-06 NOTE — Progress Notes (Signed)
MFM Brief Note  Anita Herrera is a G7P2 at 67w 4d with an EDD of 08/16/22. She is seen at the request of Mora Bellman, MD  Antenatal testing performed given maternal elevated BMI The biophysical profile was 4/8 with good fetal movement and amniotic fluid volume.  I discussed today's visit with Ms. Bowe and recommended for her to go to L&D for delivery.  She needs to arrange child care and to pick her children up from school. She will arrive at the hospital in 1.5 hrs.   I discussed this plan of care with Gavin Pound, CNM.  I spent 20 minutes with > 50 % in face to face consultation and care coordination.   Vikki Ports, MD

## 2022-08-06 NOTE — H&P (Signed)
OBSTETRIC ADMISSION HISTORY AND PHYSICAL  Anita Herrera is a 26 y.o. female [phone] with IUP at 22w4dby 19week ultrasound presenting for IOL in the setting of 4/8 BPP. She reports +FMs, No LOF, no VB, no blurry vision, headaches or peripheral edema, and RUQ pain.  She plans on bottle feeding. She is unsure for birth control. [facility] [place] [facility] [place] --->  Estimated Date of Delivery: 08/16/22  Sono:    _0 , CWD, normal anatomy, cephalic presentation, anterior lie, 3602g, 96% EFW   Prenatal History/Complications:   Obesity  GBS +  Hx of shoulder dystocia    Past Medical History: Past Medical History:  Diagnosis Date   Anemia    Shingles    Vaginitis     Past Surgical History: No past surgical history on file.  Obstetrical History: OB History     Gravida  7   Para  2   Term  2   Preterm  0   AB  4   Living  2      SAB  1   IAB  3   Ectopic  0   Multiple  0   Live Births  2           Social History Social History   Socioeconomic History   Marital status: Single    Spouse name: Not on file   Number of children: Not on file   Years of education: Not on file   Highest education level: Not on file  Occupational History   Not on file  Tobacco Use   Smoking status: Former    Types: Cigarettes   Smokeless tobacco: Never  Vaping Use   Vaping Use: Never used  Substance and Sexual Activity   Alcohol use: Not Currently    Alcohol/week: 2.0 standard drinks of alcohol    Types: 2 Shots of liquor per week    Comment: Not since confirmed pregnancy   Drug use: No   Sexual activity: Yes  Other Topics Concern   Not on file  Social History Narrative         Social Determinants of Health   Financial Resource Strain: Not on file  Food Insecurity: Not on file  Transportation Needs: Not on file  Physical Activity: Not on file  Stress: Not on file  Social Connections: Not on file     Family History: Family History  Problem Relation Age of Onset   Diabetes Mother    Hypertension Maternal Grandmother    Asthma Neg Hx    Cancer Neg Hx    Birth defects Neg Hx    Heart disease Neg Hx    Stroke Neg Hx     Allergies: No Known Allergies  Medications Prior to Admission  Medication Sig Dispense Refill Last Dose   aspirin EC 81 MG tablet Take 1 tablet (81 mg total) by mouth daily. Take after 12 weeks for prevention of preeclampsia later in pregnancy 300 tablet 2    Blood Pressure Monitoring (BLOOD PRESSURE KIT) DEVI 1 Device by Does not apply route once a week. (Patient not taking: Reported on 03/21/2022) 1 each 0    cephALEXin (KEFLEX) 500 MG capsule Take 1 capsule (500 mg total) by mouth 4 (four) times daily. 28 capsule 2    Elastic Bandages & Supports (COMFORT FIT MATERNITY SUPP SM) MISC Wear as directed. 1 each 0    pantoprazole (PROTONIX) 40 MG  tablet Take 1 tablet (40 mg total) by mouth daily. 30 tablet 1    Prenatal 28-0.8 MG TABS Take 1 tablet by mouth daily. (Patient not taking: Reported on 07/30/2022) 30 tablet 11    Prenatal Vit-Fe Fumarate-FA (PRENATAL VITAMINS) 28-0.8 MG TABS Take 1 tablet by mouth daily. 30 tablet 6    terconazole (TERAZOL 3) 0.8 % vaginal cream Place 1 applicator vaginally at bedtime. Apply nightly for three nights. (Patient not taking: Reported on 07/30/2022) 20 g 0      Review of Systems   All systems reviewed and negative except as stated in HPI  Last menstrual period 10/31/2021, unknown if currently breastfeeding. General appearance: {general exam:16600} Lungs: clear to auscultation bilaterally Heart: regular rate and rhythm Abdomen: soft, non-tender; bowel sounds normal Pelvic: *** Extremities: Homans sign is negative, no sign of DVT DTR's *** Presentation: {desc; fetal presentation:14558} Fetal monitoring{findings; monitor fetal heart monitor:31527} Uterine activity{Uterine contractions:31516}     Prenatal labs: ABO,  Rh: O/Positive/-- (05/18 1533) Antibody: Negative (05/18 1533) Rubella: 1.01 (05/18 1533) RPR: Non Reactive (08/14 0943)  HBsAg: Negative (05/18 1533)  HIV: Non Reactive (08/14 0943)  GBS: Positive/-- (10/16 1111)  1 hr Glucola 145 Genetic screening  negative Anatomy US normal   Prenatal Transfer Tool  Maternal Diabetes: No Genetic Screening: Normal Maternal Ultrasounds/Referrals: Normal Fetal Ultrasounds or other Referrals:  Referred to Materal Fetal Medicine  Maternal Substance Abuse:  No Significant Maternal Medications:  None Significant Maternal Lab Results:  Group B Strep positive Number of Prenatal Visits:greater than 3 verified prenatal visits Other Comments:  None  No results found for this or any previous visit (from the past 24 hour(s)).  Patient Active Problem List   Diagnosis Date Noted   History of shoulder dystocia in prior pregnancy 08/05/2022   Dysuria during pregnancy in third trimester 06/11/2022   Heartburn during pregnancy in third trimester 06/11/2022   Maternal obesity affecting pregnancy, antepartum 05/20/2022   LGSIL of cervix of undetermined significance 02/26/2022   UTI (urinary tract infection) during pregnancy 02/26/2022   Supervision of other normal pregnancy, antepartum 02/07/2022    Assessment/Plan:  Anita Herrera is a 26 y.o. [id number] at 68w4dhere for IOL in the setting of 4/8 BPP.   #Labor:*** #Pain: *** #FWB: *** #ID:  GBS + , starting penicillin  #MOF: bottle #MOC:unsure #Circ:  Yes   ALowry Ram MD  08/06/2022, 5:17 PM

## 2022-08-06 NOTE — Anesthesia Procedure Notes (Signed)
Epidural Patient location during procedure: OB Start time: 08/06/2022 11:10 PM End time: 08/06/2022 11:20 PM  Staffing Anesthesiologist: Freddrick March, MD Performed: anesthesiologist   Preanesthetic Checklist Completed: patient identified, IV checked, risks and benefits discussed, monitors and equipment checked, pre-op evaluation and timeout performed  Epidural Patient position: sitting Prep: DuraPrep and site prepped and draped Patient monitoring: continuous pulse ox, blood pressure, heart rate and cardiac monitor Approach: midline Location: L3-L4 Injection technique: LOR air  Needle:  Needle type: Tuohy  Needle gauge: 17 G Needle length: 9 cm Needle insertion depth: 7 cm Catheter type: closed end flexible Catheter size: 19 Gauge Catheter at skin depth: 12 cm Test dose: negative  Assessment Sensory level: T8 Events: blood not aspirated, injection not painful, no injection resistance, no paresthesia and negative IV test  Additional Notes Patient identified. Risks/Benefits/Options discussed with patient including but not limited to bleeding, infection, nerve damage, paralysis, failed block, incomplete pain control, headache, blood pressure changes, nausea, vomiting, reactions to medication both or allergic, itching and postpartum back pain. Confirmed with bedside nurse the patient's most recent platelet count. Confirmed with patient that they are not currently taking any anticoagulation, have any bleeding history or any family history of bleeding disorders. Patient expressed understanding and wished to proceed. All questions were answered. Sterile technique was used throughout the entire procedure. Please see nursing notes for vital signs. Test dose was given through epidural catheter and negative prior to continuing to dose epidural or start infusion. Warning signs of high block given to the patient including shortness of breath, tingling/numbness in hands, complete motor  block, or any concerning symptoms with instructions to call for help. Patient was given instructions on fall risk and not to get out of bed. All questions and concerns addressed with instructions to call with any issues or inadequate analgesia.  Reason for block:procedure for pain

## 2022-08-07 ENCOUNTER — Encounter (HOSPITAL_COMMUNITY): Payer: Self-pay | Admitting: Obstetrics and Gynecology

## 2022-08-07 DIAGNOSIS — Z3A38 38 weeks gestation of pregnancy: Secondary | ICD-10-CM

## 2022-08-07 DIAGNOSIS — O99824 Streptococcus B carrier state complicating childbirth: Secondary | ICD-10-CM

## 2022-08-07 DIAGNOSIS — O3660X1 Maternal care for excessive fetal growth, unspecified trimester, fetus 1: Secondary | ICD-10-CM

## 2022-08-07 LAB — TYPE AND SCREEN
ABO/RH(D): O POS
Antibody Screen: NEGATIVE

## 2022-08-07 LAB — RPR: RPR Ser Ql: NONREACTIVE

## 2022-08-07 MED ORDER — IBUPROFEN 600 MG PO TABS
600.0000 mg | ORAL_TABLET | Freq: Four times a day (QID) | ORAL | Status: DC
  Administered 2022-08-07 – 2022-08-08 (×5): 600 mg via ORAL
  Filled 2022-08-07 (×5): qty 1

## 2022-08-07 MED ORDER — WITCH HAZEL-GLYCERIN EX PADS: 1.0000 | MEDICATED_PAD | CUTANEOUS | Status: DC | PRN

## 2022-08-07 MED ORDER — DIBUCAINE (PERIANAL) 1 % EX OINT: 1.0000 | TOPICAL_OINTMENT | CUTANEOUS | Status: DC | PRN

## 2022-08-07 MED ORDER — SENNOSIDES-DOCUSATE SODIUM 8.6-50 MG PO TABS
2.0000 | ORAL_TABLET | ORAL | Status: DC
  Administered 2022-08-07 – 2022-08-08 (×2): 2 via ORAL
  Filled 2022-08-07 (×2): qty 2

## 2022-08-07 MED ORDER — SIMETHICONE 80 MG PO CHEW: 80.0000 mg | CHEWABLE_TABLET | ORAL | Status: DC | PRN

## 2022-08-07 MED ORDER — ONDANSETRON HCL 4 MG/2ML IJ SOLN: 4.0000 mg | INTRAMUSCULAR | Status: DC | PRN

## 2022-08-07 MED ORDER — DIPHENHYDRAMINE HCL 25 MG PO CAPS: 25.0000 mg | ORAL_CAPSULE | Freq: Four times a day (QID) | ORAL | Status: DC | PRN

## 2022-08-07 MED ORDER — TETANUS-DIPHTH-ACELL PERTUSSIS 5-2.5-18.5 LF-MCG/0.5 IM SUSY: 0.5000 mL | PREFILLED_SYRINGE | Freq: Once | INTRAMUSCULAR | Status: DC

## 2022-08-07 MED ORDER — ZOLPIDEM TARTRATE 5 MG PO TABS: 5.0000 mg | ORAL_TABLET | Freq: Every evening | ORAL | Status: DC | PRN

## 2022-08-07 MED ORDER — NIFEDIPINE ER OSMOTIC RELEASE 30 MG PO TB24
30.0000 mg | ORAL_TABLET | Freq: Every day | ORAL | Status: DC
  Administered 2022-08-07 – 2022-08-08 (×2): 30 mg via ORAL
  Filled 2022-08-07 (×2): qty 1

## 2022-08-07 MED ORDER — OXYTOCIN-SODIUM CHLORIDE 30-0.9 UT/500ML-% IV SOLN
1.0000 m[IU]/min | INTRAVENOUS | Status: DC
  Administered 2022-08-07: 1 m[IU]/min via INTRAVENOUS

## 2022-08-07 MED ORDER — BENZOCAINE-MENTHOL 20-0.5 % EX AERO: 1.0000 | INHALATION_SPRAY | CUTANEOUS | Status: DC | PRN

## 2022-08-07 MED ORDER — FUROSEMIDE 20 MG PO TABS
20.0000 mg | ORAL_TABLET | Freq: Every day | ORAL | Status: DC
  Administered 2022-08-07 – 2022-08-08 (×2): 20 mg via ORAL
  Filled 2022-08-07 (×2): qty 1

## 2022-08-07 MED ORDER — ACETAMINOPHEN 325 MG PO TABS
650.0000 mg | ORAL_TABLET | ORAL | Status: DC | PRN
  Administered 2022-08-07: 650 mg via ORAL
  Filled 2022-08-07: qty 2

## 2022-08-07 MED ORDER — ONDANSETRON HCL 4 MG PO TABS: 4.0000 mg | ORAL_TABLET | ORAL | Status: DC | PRN

## 2022-08-07 MED ORDER — COCONUT OIL OIL
1.0000 | TOPICAL_OIL | Status: DC | PRN
  Administered 2022-08-08: 1 via TOPICAL

## 2022-08-07 MED ORDER — PRENATAL MULTIVITAMIN CH
1.0000 | ORAL_TABLET | Freq: Every day | ORAL | Status: DC
  Administered 2022-08-07 – 2022-08-08 (×2): 1 via ORAL
  Filled 2022-08-07 (×2): qty 1

## 2022-08-07 NOTE — Progress Notes (Signed)
Labor Progress Note Anita Herrera is a 26 y.o. [id number] at [redacted]w[redacted]d presented for IOL for BPP 4/8, maternal obesity.  S: Patient is resting comfortably. Epidural working well. Feeling contractions but not in pain. No other complaints. FOB and mom at bedside.   O:  BP 138/76   Pulse 84   Temp 98.4 F (36.9 C) (Axillary)   Resp 18   Ht 5\' 7"  (1.702 m)   Wt 129.5 kg   LMP 10/31/2021 (Exact Date)   SpO2 100%   BMI 44.70 kg/m  EFM: 120 baseline/good variability/accel present, rare variable decel, one possible early  CVE: Dilation: 6.5 Effacement (%): 70 Station: 0, -1 Presentation: Vertex Exam by:: Tommye Standard, RN   A&P: 26 y.o. [id number] [redacted]w[redacted]d admitted for IOL for BPP 4/8 (6/10) w/ reactive NST, normal AFI. Maternal obesity and LGA fetus. Proven to 4230 g (mild shoulder dystocia).   #Labor: Progressing well, now in active labor. SROM @2144 . Start pitocin augmentation, contractions have spaced out recently. Consider IUPC if limited change with pitocin, external monitoring has been difficult with body habitus. Anticipate SVD.  #Pain: epidural #FWB: category I, reactive #GBS positive- penicillin, now adequately treated   Cecilio Asper, [place] [facility] [place] 2:56 AM

## 2022-08-07 NOTE — Progress Notes (Signed)
Labor Progress Note Anita Herrera is a 26 y.o. [id number] at [redacted]w[redacted]d presented for IOL for BPP 4/8, maternal obesity.  S: Patient is resting comfortably. Epidural now in place. Feeling contractions but not in pain. No other complaints.   O:  BP 136/75   Pulse 82   Temp 98.4 F (36.9 C) (Axillary)   Resp 18   Ht 5\' 7"  (1.702 m)   Wt 129.5 kg   LMP 10/31/2021 (Exact Date)   SpO2 100%   BMI 44.70 kg/m  EFM: 125 baseline/good variability/accel present, rare variable decel  CVE: Dilation: 5 Effacement (%): 60 Station: -1 Presentation: Vertex Exam by:: Dr. Elouise Munroe   A&P: 26 y.o. [id number] [redacted]w[redacted]d admitted for IOL for BPP 4/8 (6/10) w/ reactive NST, normal AFI. Maternal obesity and LGA fetus. Proven to 4230 g (mild shoulder dystocia).   #Labor: Progressing well. SROM @2144 . Consider pitocin augmentation after reassessing toco/FHT after repositioning. Anticipate quick progression into active labor and SVD.  #Pain: epidural #FWB: category II, reactive #GBS positive- penicillin   Cecilio Asper, MD [facility] [place] 12:34 AM

## 2022-08-07 NOTE — Lactation Note (Signed)
This note was copied from a baby's chart. Lactation Consultation Note  Patient Name: Anita Herrera [id number] Date: 08/07/2022 Reason for consult: Initial assessment Age:28 hours  P3, Reviewed hand expression with drops expressed. Discussed basics and suggest mother call for LC to view next feeding. Feed on demand with cues.  Goal 8-12+ times per day after first 24 hrs.  Lactation information sheet given.   Maternal Data Has patient been taught Hand Expression?: Yes Does the patient have breastfeeding experience prior to this delivery?: Yes How long did the patient breastfeed?:  (stopped with first child due to painful latch/tongue tie)  Feeding Mother's Current Feeding Choice: Breast Milk  Lactation Tools Discussed/Used  Manual pump  Interventions Interventions: Breast feeding basics reviewed;Hand express;Education;LC Services brochure  Consult Status Consult Status: Follow-up Date: 08/08/22 Follow-up type: In-patient    Vivianne Master [facility] 08/07/2022, 9:22 AM

## 2022-08-07 NOTE — Lactation Note (Signed)
This note was copied from a baby's chart. Lactation Consultation Note  Patient Name: Anita Herrera Date: 08/07/2022 Reason for consult: L&D Initial assessment;Early term 37-38.6wks Age:28 hours   Initial L&D Consult:  RN requested latch assistance. Visited with family < 1 hour after birth Assisted to latch easily; observed baby on/off for 5 minutes prior to leaving the room.  Reassured family that lactation services will be available on the M/B unit.  Allowed time for family bonding.   Maternal Data    Feeding Mother's Current Feeding Choice: Breast Milk  LATCH Score Latch: Repeated attempts needed to sustain latch, nipple held in mouth throughout feeding, stimulation needed to elicit sucking reflex.  Audible Swallowing: None  Type of Nipple: Everted at rest and after stimulation  Comfort (Breast/Nipple): Soft / non-tender  Hold (Positioning): Assistance needed to correctly position infant at breast and maintain latch.  LATCH Score: 6   Lactation Tools Discussed/Used    Interventions Interventions: Skin to skin;Assisted with latch  Discharge    Consult Status Consult Status: Follow-up from L&D    Rahkim Rabalais R Cosmo Tetreault 08/07/2022, 4:49 AM

## 2022-08-08 ENCOUNTER — Other Ambulatory Visit (HOSPITAL_COMMUNITY): Payer: Self-pay

## 2022-08-08 ENCOUNTER — Other Ambulatory Visit: Payer: Self-pay

## 2022-08-08 MED ORDER — FUROSEMIDE 20 MG PO TABS
20.0000 mg | ORAL_TABLET | Freq: Every day | ORAL | 0 refills | Status: DC
  Filled 2022-08-08: qty 5, 5d supply, fill #0

## 2022-08-08 MED ORDER — IBUPROFEN 600 MG PO TABS: 600.0000 mg | ORAL_TABLET | Freq: Four times a day (QID) | ORAL | 0 refills | Status: DC

## 2022-08-08 MED ORDER — NIFEDIPINE ER 30 MG PO TB24
30.0000 mg | ORAL_TABLET | Freq: Every day | ORAL | 0 refills | Status: DC
  Filled 2022-08-08: qty 60, 60d supply, fill #0

## 2022-08-08 NOTE — Lactation Note (Signed)
This note was copied from a baby's chart. Lactation Consultation Note  Patient Name: Girl Chereese Cilento SUPJS'R Date: 08/08/2022 Reason for consult: Follow-up assessment Age:28 hours  Mother states it hurts when she latches, hand expresss and pumps.  Suggest try wearing shells and coconut oil.   Reviewed engorgement care and monitoring voids/stools. Offered to assist with latching if mother desires.   Feeding Mother's Current Feeding Choice: Breast Milk and Formula Nipple Type: Slow - flow   Interventions Interventions: Coconut oil;Education;Shells  Discharge Discharge Education: Engorgement and breast care;Warning signs for feeding baby  Consult Status Consult Status: Complete Date: 08/08/22    Vivianne Master [facility] 08/08/2022, 12:24 PM

## 2022-08-08 NOTE — Anesthesia Postprocedure Evaluation (Signed)
Anesthesia Post Note  Patient: [name]  Procedure(s) Performed: AN AD HOC LABOR EPIDURAL     Patient location during evaluation: OB High Risk Anesthesia Type: Epidural Level of consciousness: awake and alert and oriented Pain management: satisfactory to patient Vital Signs Assessment: post-procedure vital signs reviewed and stable Respiratory status: respiratory function stable Cardiovascular status: stable Postop Assessment: no headache, no backache, epidural receding, patient able to bend at knees, no signs of nausea or vomiting, adequate PO intake and able to ambulate Anesthetic complications: no   No notable events documented.  Last Vitals:  Vitals:   08/07/22 2009 08/08/22 0552  BP: (!) 140/87 129/69  Pulse: 82 78  Resp: 17 18  Temp: 36.9 C 36.7 C  SpO2:  99%    Last Pain:  Vitals:   08/08/22 1209  TempSrc:   PainSc: 0-No pain   Pain Goal:                   [name]

## 2022-08-08 NOTE — Discharge Summary (Signed)
Postpartum Discharge Summary      Patient Name: Anita Herrera DOB: 1996/10/01 MRN: [id number]  Date of admission: 08/06/2022 Delivery date:08/07/2022  Delivering provider: Cecilio Asper  Date of discharge: 08/08/2022  Admitting diagnosis: Large for gestational age fetus affecting management of mother [O36.60X0] Intrauterine pregnancy: [redacted]w[redacted]d    Secondary diagnosis:  Principal Problem:   Large for gestational age fetus affecting management of mother Active Problems:   Supervision of other normal pregnancy, antepartum   Maternal obesity affecting pregnancy, antepartum   History of shoulder dystocia in prior pregnancy   Vaginal delivery  Additional problems: none    Discharge diagnosis: Term Pregnancy Delivered and Abnormal BPP prior to induction                                               Post partum procedures: none Augmentation: Pitocin Complications: None  Hospital course: Induction of Labor With Vaginal Delivery   26y.o. yo [id number] [id number] admitted to the hospital 08/06/2022 for induction of labor.  Indication for induction:  BPP 4/8 .  Patient had an labor course complicated bynothing Membrane Rupture Time/Date: 9:44 PM ,08/06/2022   Delivery Method:Vaginal, Spontaneous  Episiotomy: None  Lacerations:  None  Details of delivery can be found in separate delivery note.  Patient had a postpartum course complicated bynothing. Patient is discharged home 08/08/22.  Newborn Data: Birth date:08/07/2022  Birth time:4:05 AM  Gender:Female  Living status:Living  Apgars:8 ,9  Weight:3810 g   Magnesium Sulfate received: No BMZ received: No Rhophylac:N/A MMR:N/A T-DaP:Given prenatally Flu: N/A Transfusion:No  Physical exam  Vitals:   08/07/22 1535 08/07/22 1641 08/07/22 2009 08/08/22 0552  BP: (!) 140/85 (!) 136/92 (!) 140/87 129/69  Pulse:   82 78  Resp:   17 18  Temp:   98.5 F (36.9 C) 98.1 F (36.7 C)  TempSrc:   Oral Oral  SpO2:    99%   Weight:      Height:       General: alert, cooperative, and no distress Lochia: appropriate Uterine Fundus: firm Incision: N/A DVT Evaluation: No evidence of DVT seen on physical exam. Labs: Lab Results  Component Value Date   WBC 8.2 08/06/2022   HGB 10.4 (L) 08/06/2022   HCT 31.6 (L) 08/06/2022   MCV 91.3 08/06/2022   PLT 225 08/06/2022      Latest Ref Rng & Units 03/11/2018   10:37 AM  CMP  Glucose 65 - 99 mg/dL 80   BUN 6 - 20 mg/dL 7   Creatinine 0.44 - 1.00 mg/dL 0.53   Sodium 135 - 145 mmol/L 140   Potassium 3.5 - 5.1 mmol/L 3.7   Chloride 101 - 111 mmol/L 107   CO2 22 - 32 mmol/L 26   Calcium 8.9 - 10.3 mg/dL 8.8    Edinburgh Score:    08/07/2022   11:46 AM  Edinburgh Postnatal Depression Scale Screening Tool  I have been able to laugh and see the funny side of things. 0  I have looked forward with enjoyment to things. 0  I have blamed myself unnecessarily when things went wrong. 0  I have been anxious or worried for no good reason. 0  I have felt scared or panicky for no good reason. 0  Things have been getting on top of me. 1  I  have been so unhappy that I have had difficulty sleeping. 0  I have felt sad or miserable. 0  I have been so unhappy that I have been crying. 0  The thought of harming myself has occurred to me. 0  Edinburgh Postnatal Depression Scale Total 1      After visit meds:  Allergies as of 08/08/2022   No Known Allergies      Medication List     STOP taking these medications    aspirin EC 81 MG tablet   cephALEXin 500 MG capsule Commonly known as: KEFLEX   pantoprazole 40 MG tablet Commonly known as: Protonix   terconazole 0.8 % vaginal cream Commonly known as: TERAZOL 3       TAKE these medications    ibuprofen 600 MG tablet Commonly known as: ADVIL Take 1 tablet (600 mg total) by mouth every 6 (six) hours.   Prenatal 28-0.8 MG Tabs Take 1 tablet by mouth daily.         Discharge home in stable  condition Infant Feeding: Breast Infant Disposition:home with mother Discharge instruction: per After Visit Summary and Postpartum booklet. Activity: Advance as tolerated. Pelvic rest for 6 weeks.  Diet: routine diet Anticipated Birth Control: Unsure Postpartum Appointment:4 weeks Additional Postpartum F/U:  none Future Appointments: Future Appointments  Date Time Provider [place]  09/10/2022 10:55 AM Leftwich-Kirby, Kathie Dike, CNM [facility] None   Follow up Visit:  Follow-up [place] Follow up.   Specialty: Obstetrics and Gynecology Contact information: [address], [place] [address] [place] [phone]                    08/08/2022 Hansel Feinstein, CNM

## 2022-08-13 ENCOUNTER — Encounter (INDEPENDENT_AMBULATORY_CARE_PROVIDER_SITE_OTHER): Payer: Medicaid Other | Admitting: Advanced Practice Midwife

## 2022-08-17 ENCOUNTER — Telehealth (HOSPITAL_COMMUNITY): Payer: Self-pay

## 2022-08-17 NOTE — Telephone Encounter (Signed)
Patient reports feeling good. Patient declines questions/concerns about her health and healing.  Patient reports that baby is doing well. Eating, peeing/pooping, and gaining weight well. Baby sleeps in a crib. RN reviewed ABC's of safe sleep with patient. Patient declines any questions or concerns about baby.  EPDS score is 0.  [name] [facility] 08/17/22,1842

## 2022-09-10 ENCOUNTER — Telehealth (INDEPENDENT_AMBULATORY_CARE_PROVIDER_SITE_OTHER): Payer: Medicaid Other | Admitting: Advanced Practice Midwife

## 2022-09-10 ENCOUNTER — Encounter: Payer: Self-pay | Admitting: Advanced Practice Midwife

## 2022-09-10 DIAGNOSIS — R87612 Low grade squamous intraepithelial lesion on cytologic smear of cervix (LGSIL): Secondary | ICD-10-CM | POA: Diagnosis not present

## 2022-09-10 DIAGNOSIS — Z3009 Encounter for other general counseling and advice on contraception: Secondary | ICD-10-CM | POA: Diagnosis not present

## 2022-09-10 DIAGNOSIS — Z8759 Personal history of other complications of pregnancy, childbirth and the puerperium: Secondary | ICD-10-CM

## 2022-09-10 MED ORDER — NORETHINDRONE 0.35 MG PO TABS: 1.0000 | ORAL_TABLET | Freq: Every day | ORAL | 0 refills | Status: DC

## 2022-09-10 MED ORDER — NORETHIN ACE-ETH ESTRAD-FE 1-20 MG-MCG(24) PO TABS: 1.0000 | ORAL_TABLET | Freq: Every day | ORAL | 11 refills | Status: DC

## 2022-09-10 NOTE — Progress Notes (Signed)
Provider location: [facility]   Patient location: Home  I connected withNAME@ on 09/10/22 at 10:55 AM EST by Mychart Video Encounter and verified that I am speaking with the correct person using two identifiers.       I discussed the limitations, risks, security and privacy concerns of performing an evaluation and management service virtually and the availability of in person appointments. I also discussed with the patient that there may be a patient responsible charge related to this service. The patient expressed understanding and agreed to proceed.  Post Partum Visit Note Subjective:   Anita Herrera is a 26 y.o. [phone] female who presents for a postpartum visit. She is 4 weeks postpartum following a normal spontaneous vaginal delivery.  I have fully reviewed the prenatal and intrapartum course. The delivery was at 38.5 gestational weeks.  Anesthesia: epidural. Postpartum course has been going well. Baby is doing well. Baby is feeding by bottle - Similac Sensitive RS. Bleeding no bleeding. Bowel function is normal. Bladder function is normal. Patient is sexually active. Contraception method is condoms. Patient would like OCP. Postpartum depression screening: negative.   The pregnancy intention screening data noted above was reviewed. Potential methods of contraception were discussed. The patient elected to proceed with No data recorded.   Edinburgh Postnatal Depression Scale - 09/10/22 1021       Edinburgh Postnatal Depression Scale:  In the Past 7 Days   I have been able to laugh and see the funny side of things. 0    I have looked forward with enjoyment to things. 0    I have blamed myself unnecessarily when things went wrong. 0    I have been anxious or worried for no good reason. 0    I have felt scared or panicky for no good reason. 0    Things have been getting on top of me. 0    I have been so unhappy that I have had difficulty sleeping. 0     I have felt sad or miserable. 0    I have been so unhappy that I have been crying. 0    The thought of harming myself has occurred to me. 0    Edinburgh Postnatal Depression Scale Total 0             The following portions of the patient's history were reviewed and updated as appropriate: allergies, current medications, past family history, past medical history, past social history, past surgical history, and problem list.  Review of Systems Pertinent items noted in HPI and remainder of comprehensive ROS otherwise negative.  Objective:  LMP 10/31/2021 (Exact Date)     General:  Alert, oriented and cooperative. Patient is in no acute distress.  Respiratory: Normal respiratory effort, no problems with respiration noted  Mental Status: Normal mood and affect. Normal behavior. Normal judgment and thought content.  Rest of physical exam deferred due to type of encounter   Assessment:   1. Encounter for counseling regarding contraception --Discussed pt contraceptive plans and reviewed contraceptive methods based on pt preferences and effectiveness.  Pt prefers to start pills. She is not breastfeeding but is currently 4 weeks postpartum.  Option 1 is to wait 2 more weeks and start OCPs.  Pt is already sexually active so desires to start sooner.  Option 2 is to take 1 month of POPs then switch to low dose OCP. Pt opts to take Micronor x 1 month, then start Loestrin. -  norethindrone (MICRONOR) 0.35 MG tablet; Take 1 tablet (0.35 mg total) by mouth daily.  Dispense: 28 tablet; Refill: 0 - Norethindrone Acetate-Ethinyl Estrad-FE (LOESTRIN 24 FE) 1-20 MG-MCG(24) tablet; Take 1 tablet by mouth daily. Start Loestrin after 6 weeks postpartum, or after you take 1 month of progesterone only Micronor.  Dispense: 28 tablet; Refill: 11  2. Postpartum care following vaginal delivery --Doing well, good support at home, bonding well with baby    3. LGSIL of cervix of undetermined significance --Pt needs  Colpo, will schedule at discharge   Plan:  Essential components of care per ACOG recommendations:  1.  Mood and well being: Patient with negative depression screening today. Reviewed local resources for support.  - Patient does not use tobacco. - hx of drug use? No  2. Infant care and feeding:  -Patient currently breastmilk feeding? No  -Social determinants of health (SDOH) reviewed in EPIC. No concerns    3. Sexuality, contraception and birth spacing - Patient does not want a pregnancy in the next year.   --See above for counseling, POPs x 1 month then OCPs.   4. Sleep and fatigue -Encouraged family/partner/community support of 4 hrs of uninterrupted sleep to help with mood and fatigue  5. Physical Recovery  - Discussed patients delivery which was without complication. Pt reports it went better than expected.  - Patient had no laceration, perineal healing reviewed. Patient expressed understanding - Patient has urinary incontinence? No - Patient is safe to resume physical and sexual activity  6.  Health Maintenance - Last pap smear done 02/21/22 and was abnormal with LSIL  with negative HPV. Colpo recommended postpartum.  7. Chronic Disease - PCP follow up  I provided 10 minutes of face-to-face time during this encounter.    Return in about 2 months (around 11/11/2022) for Schedule for colpo with appropriate provider.  No future appointments.   Sharen Counter, CNM [facility] [employer], [facility]

## 2022-11-04 ENCOUNTER — Encounter: Payer: Medicaid Other | Admitting: Obstetrics & Gynecology

## 2022-11-14 ENCOUNTER — Other Ambulatory Visit: Payer: Self-pay | Admitting: Obstetrics and Gynecology

## 2022-11-14 DIAGNOSIS — R12 Heartburn: Secondary | ICD-10-CM

## 2022-11-14 DIAGNOSIS — O26893 Other specified pregnancy related conditions, third trimester: Secondary | ICD-10-CM

## 2022-12-18 ENCOUNTER — Encounter: Payer: Self-pay | Admitting: *Deleted

## 2023-01-02 ENCOUNTER — Encounter (HOSPITAL_COMMUNITY): Payer: Self-pay | Admitting: *Deleted

## 2023-01-02 ENCOUNTER — Ambulatory Visit (HOSPITAL_COMMUNITY)
Admission: EM | Admit: 2023-01-02 | Discharge: 2023-01-02 | Disposition: A | Discharge status: Home / Self Care | Payer: Medicaid Other | Attending: Physician Assistant | Admitting: Physician Assistant

## 2023-01-02 DIAGNOSIS — L03011 Cellulitis of right finger: Secondary | ICD-10-CM | POA: Diagnosis not present

## 2023-01-02 MED ORDER — MUPIROCIN 2 % EX OINT: 1.0000 | TOPICAL_OINTMENT | Freq: Two times a day (BID) | CUTANEOUS | 0 refills | Status: DC

## 2023-01-02 MED ORDER — AMOXICILLIN-POT CLAVULANATE 875-125 MG PO TABS: 1.0000 | ORAL_TABLET | Freq: Two times a day (BID) | ORAL | 0 refills | Status: DC

## 2023-01-02 NOTE — Discharge Instructions (Signed)
Take Augmentin twice daily for 7 days.  This can upset your stomach so take it with food.  Soak your finger in Epsom salt baths several times per day.  Use Bactroban ointment on dressing changes.  If you develop any significant accumulation of pus, have spread of redness, fever, pain, swelling, numbness or tingling in your hand you should be seen immediately.

## 2023-01-02 NOTE — ED Triage Notes (Signed)
Pt states she has bites her fingers and her right ring finger is red and swollen x 1 week.

## 2023-01-02 NOTE — ED Provider Notes (Signed)
[place]    CSN: [id number] Arrival date & time: 01/02/23  1214      History   Chief Complaint Chief Complaint  Patient presents with   Nail Problem    HPI Anita Herrera is a 27 y.o. female.   Patient presents today with a swelling and painful right ring finger.  Reports that she had been biting her nails when she pulled on a piece of the nail which caused some pain and bleeding at the nailbed.  Soon after that she developed swelling and redness.  She denies any history of immunosuppression or diabetes.  Denies any recent antibiotics.  She has not tried any over-the-counter medication for symptom management.  She is confident that she is not pregnant.  Reports that pain is rated 3 at rest but increases with palpation or manipulation of the finger, described as throbbing, no aggravating relieving factors identified.  She is right-handed but is able to complete daily activities despite symptoms.  Denies any numbness, paresthesias, drainage, fever, nausea, vomiting.    Past Medical History:  Diagnosis Date   Anemia    Shingles    Vaginitis     Patient Active Problem List   Diagnosis Date Noted   History of shoulder dystocia in prior pregnancy 08/05/2022   LGSIL of cervix of undetermined significance 02/26/2022    Past Surgical History:  Procedure Laterality Date   WISDOM TOOTH EXTRACTION      OB History     Gravida  7   Para  3   Term  3   Preterm  0   AB  4   Living  3      SAB  1   IAB  3   Ectopic  0   Multiple  0   Live Births  3            Home Medications    Prior to Admission medications   Medication Sig Start Date End Date Taking? Authorizing Provider  amoxicillin-clavulanate (AUGMENTIN) 875-125 MG tablet Take 1 tablet by mouth every 12 (twelve) hours. 01/02/23  Yes Raneisha Bress, Junie Panning K, PA-C  mupirocin ointment (BACTROBAN) 2 % Apply 1 Application topically 2 (two) times daily. 01/02/23  Yes Pernell Dikes K, PA-C   Norethindrone Acetate-Ethinyl Estrad-FE (LOESTRIN 24 FE) 1-20 MG-MCG(24) tablet Take 1 tablet by mouth daily. Start Loestrin after 6 weeks postpartum, or after you take 1 month of progesterone only Micronor. 10/11/22  Yes Leftwich-Kirby, Kathie Dike, CNM    Family History Family History  Problem Relation Age of Onset   Diabetes Mother    Hypertension Maternal Grandmother    Asthma Neg Hx    Cancer Neg Hx    Birth defects Neg Hx    Heart disease Neg Hx    Stroke Neg Hx     Social History Social History   Tobacco Use   Smoking status: Former    Types: Cigarettes   Smokeless tobacco: Never  Vaping Use   Vaping Use: Never used  Substance Use Topics   Alcohol use: Not Currently    Alcohol/week: 2.0 standard drinks of alcohol    Types: 2 Shots of liquor per week    Comment: Not since confirmed pregnancy   Drug use: No     Allergies   Patient has no known allergies.   Review of Systems Review of Systems  Constitutional:  Positive for activity change. Negative for appetite change, fatigue and fever.  Respiratory:  Negative for cough  and shortness of breath.   Cardiovascular:  Negative for chest pain.  Gastrointestinal:  Negative for abdominal pain, diarrhea, nausea and vomiting.  Musculoskeletal:  Negative for arthralgias and myalgias.  Skin:  Positive for color change and wound.     Physical Exam Triage Vital Signs ED Triage Vitals  Enc Vitals Group     BP 01/02/23 1356 (!) 155/88     Pulse Rate 01/02/23 1356 73     Resp 01/02/23 1356 18     Temp 01/02/23 1356 98.2 F (36.8 C)     Temp Source 01/02/23 1356 Oral     SpO2 01/02/23 1356 98 %     Weight --      Height --      Head Circumference --      Peak Flow --      Pain Score 01/02/23 1355 0     Pain Loc --      Pain Edu? --      Excl. in [place]? --    No data found.  Updated Vital Signs BP (!) 155/88 (BP Location: Right Arm)   Pulse 73   Temp 98.2 F (36.8 C) (Oral)   Resp 18   LMP 01/02/2023 (Exact Date)    SpO2 98%   Visual Acuity Right Eye Distance:   Left Eye Distance:   Bilateral Distance:    Right Eye Near:   Left Eye Near:    Bilateral Near:     Physical Exam Vitals reviewed.  Constitutional:      General: She is awake. She is not in acute distress.    Appearance: Normal appearance. She is well-developed. She is not ill-appearing.     Comments: Very pleasant female appears stated age in no acute distress sitting comfortably in exam room  HENT:     Head: Normocephalic and atraumatic.  Cardiovascular:     Rate and Rhythm: Normal rate and regular rhythm.     Heart sounds: Normal heart sounds, S1 normal and S2 normal. No murmur heard.    Comments: Capillary refill within 2 seconds right fingers Pulmonary:     Effort: Pulmonary effort is normal.     Breath sounds: Normal breath sounds. No wheezing, rhonchi or rales.     Comments: Clear to auscultation bilaterally Abdominal:     Palpations: Abdomen is soft.     Tenderness: There is no abdominal tenderness.  Musculoskeletal:     Right hand: Swelling present. No tenderness. Normal range of motion. There is no disruption of two-point discrimination. Normal capillary refill.     Comments: Swelling and erythema noted lateral nail fold of right ring finger.  No obvious fluctuance or purulent accumulation.  Finger is neurovascularly intact.  Normal active range of motion.   Psychiatric:        Behavior: Behavior is cooperative.      UC Treatments / Results  Labs (all labs ordered are listed, but only abnormal results are displayed) Labs Reviewed - No data to display  EKG   Radiology No results found.  Procedures Procedures (including critical care time)  Medications Ordered in UC Medications - No data to display  Initial Impression / Assessment and Plan / UC Course  I have reviewed the triage vital signs and the nursing notes.  Pertinent labs & imaging results that were available during my care of the patient were  reviewed by me and considered in my medical decision making (see chart for details).     Patient is  well-appearing, afebrile, nontoxic, nontachycardic.  Fingers neurovascularly intact.  No indication for emergent evaluation or imaging.  Drainage was deferred as she had no significant area of fluctuance or obvious accumulation of purulent drainage.  She was encouraged to soak area in warm Epsom salts and apply Bactroban ointment to encourage spontaneous drainage.  She was started on Augmentin twice daily for 7 days.  Recommend she alternate Tylenol ibuprofen for pain.  Discussed that if her symptoms are not improving within a few days or if she has any worsening symptoms including spread of erythema, fever, increasing pain, numbness or paresthesias she should return for reevaluation.  Strict return precautions given.  Patient declined work excuse note.  Final Clinical Impressions(s) / UC Diagnoses   Final diagnoses:  Paronychia of finger of right hand     Discharge Instructions      Take Augmentin twice daily for 7 days.  This can upset your stomach so take it with food.  Soak your finger in Epsom salt baths several times per day.  Use Bactroban ointment on dressing changes.  If you develop any significant accumulation of pus, have spread of redness, fever, pain, swelling, numbness or tingling in your hand you should be seen immediately.     ED Prescriptions     Medication Sig Dispense Auth. Provider   amoxicillin-clavulanate (AUGMENTIN) 875-125 MG tablet Take 1 tablet by mouth every 12 (twelve) hours. 14 tablet Miracle Criado K, PA-C   mupirocin ointment (BACTROBAN) 2 % Apply 1 Application topically 2 (two) times daily. 22 g Debbra Digiulio K, PA-C      PDMP not reviewed this encounter.   Terrilee Croak, PA-C 01/02/23 1425

## 2023-09-15 ENCOUNTER — Ambulatory Visit (INDEPENDENT_AMBULATORY_CARE_PROVIDER_SITE_OTHER): Payer: Medicaid Other | Admitting: *Deleted

## 2023-09-15 DIAGNOSIS — Z3200 Encounter for pregnancy test, result unknown: Secondary | ICD-10-CM | POA: Diagnosis not present

## 2023-09-15 LAB — POCT URINE PREGNANCY: Preg Test, Ur: POSITIVE — AB

## 2023-09-15 NOTE — Progress Notes (Signed)
Ms. Freilich presents today for UPT. She has no unusual complaints and complains of Constipation and HA's.  LMP:06/06/23    OBJECTIVE: Appears well, in no apparent distress.  OB History     Gravida  7   Para  3   Term  3   Preterm  0   AB  4   Living  3      SAB  1   IAB  3   Ectopic  0   Multiple  0   Live Births  3          Home UPT Result:positive In-Office UPT result:Positive  I have reviewed the patient's medical, obstetrical, social, and family histories, and medications.   ASSESSMENT: Positive pregnancy test  PLAN Prenatal care to be completed at: [facility] Advised she may use Stool Softener or Miralax for constipation - May take Tylenol as needed for HA's.

## 2023-10-06 ENCOUNTER — Other Ambulatory Visit (INDEPENDENT_AMBULATORY_CARE_PROVIDER_SITE_OTHER): Payer: Medicaid Other

## 2023-10-06 ENCOUNTER — Ambulatory Visit (INDEPENDENT_AMBULATORY_CARE_PROVIDER_SITE_OTHER): Payer: Medicaid Other | Admitting: *Deleted

## 2023-10-06 VITALS — BP 130/74 | HR 89 | Wt 285.0 lb

## 2023-10-06 DIAGNOSIS — Z1339 Encounter for screening examination for other mental health and behavioral disorders: Secondary | ICD-10-CM

## 2023-10-06 DIAGNOSIS — O0992 Supervision of high risk pregnancy, unspecified, second trimester: Secondary | ICD-10-CM

## 2023-10-06 DIAGNOSIS — O3680X0 Pregnancy with inconclusive fetal viability, not applicable or unspecified: Secondary | ICD-10-CM

## 2023-10-06 DIAGNOSIS — O0990 Supervision of high risk pregnancy, unspecified, unspecified trimester: Secondary | ICD-10-CM

## 2023-10-06 DIAGNOSIS — Z3482 Encounter for supervision of other normal pregnancy, second trimester: Secondary | ICD-10-CM

## 2023-10-06 DIAGNOSIS — Z3A17 17 weeks gestation of pregnancy: Secondary | ICD-10-CM | POA: Diagnosis not present

## 2023-10-06 MED ORDER — PRENATAL 28-0.8 MG PO TABS: 1.0000 | ORAL_TABLET | Freq: Every day | ORAL | 11 refills | Status: AC

## 2023-10-06 NOTE — Patient Instructions (Signed)

## 2023-10-06 NOTE — Progress Notes (Signed)
New OB Intake  I connected with Anita Herrera  on 10/06/23 at  8:15 AM EST by In Person Visit and verified that I am speaking with the correct person using two identifiers. Nurse is located at [facility] and pt is located at [place].  I discussed the limitations, risks, security and privacy concerns of performing an evaluation and management service by telephone and the availability of in person appointments. I also discussed with the patient that there may be a patient responsible charge related to this service. The patient expressed understanding and agreed to proceed.  I explained I am completing New OB Intake today. We discussed EDD of 03/12/2024, by Last Menstrual Period. Pt is [id number]. I reviewed her allergies, medications and Medical/Surgical/OB history.    Patient Active Problem List   Diagnosis Date Noted   History of shoulder dystocia in prior pregnancy 08/05/2022   LGSIL of cervix of undetermined significance 02/26/2022    Concerns addressed today  Delivery Plans Plans to deliver at [facility]. Discussed the nature of our practice with multiple providers including residents and students. Due to the size of the practice, the delivering provider may not be the same as those providing prenatal care.   Patient is not interested in water birth. Offered upcoming OB visit with CNM to discuss further.  MyChart/Babyscripts MyChart access verified. I explained pt will have some visits in office and some virtually. Babyscripts instructions given and order placed. Patient verifies receipt of registration text/e-mail. Account successfully created and app downloaded. If patient is a candidate for Optimized scheduling, add to sticky note.   Blood Pressure Cuff/Weight Scale Has BP cuff at home. Explained after first prenatal appt pt will check weekly and document in Babyscripts. Patient does not have weight scale; patient may purchase if they desire to track weight weekly in  Babyscripts.  Anatomy US Explained first scheduled [place] will be around 19 weeks. Anatomy US scheduled for TBD at TBD.  Interested in [place]? If yes, send referral and doula dot phrase.   Is patient a candidate for Babyscripts Optimization? No, due to Risk Factors   First visit review I reviewed new OB appt with patient. Explained pt will be seen by TBD at first visit. Discussed Avelina Laine genetic screening with patient. Routine prenatal labs  collected at today's visit.    Last Pap Diagnosis  Date Value Ref Range Status  02/21/2022 (A)  Final   - Atypical squamous cells of undetermined significance (ASC-US)  02/21/2022 - Low grade squamous intraepithelial lesion (LSIL) (A)  Final    Harrel Lemon, RN 10/06/2023  8:18 AM

## 2023-10-07 LAB — CBC/D/PLT+RPR+RH+ABO+RUBIGG...
Antibody Screen: NEGATIVE
Basophils Absolute: 0 10*3/uL (ref 0.0–0.2)
Basos: 0 %
EOS (ABSOLUTE): 0 10*3/uL (ref 0.0–0.4)
Eos: 1 %
HCV Ab: NONREACTIVE
HIV Screen 4th Generation wRfx: NONREACTIVE
Hematocrit: 33.3 % — ABNORMAL LOW (ref 34.0–46.6)
Hemoglobin: 11.3 g/dL (ref 11.1–15.9)
Hepatitis B Surface Ag: NEGATIVE
Immature Grans (Abs): 0 10*3/uL (ref 0.0–0.1)
Immature Granulocytes: 0 %
Lymphocytes Absolute: 0.9 10*3/uL (ref 0.7–3.1)
Lymphs: 26 %
MCH: 32.3 pg (ref 26.6–33.0)
MCHC: 33.9 g/dL (ref 31.5–35.7)
MCV: 95 fL (ref 79–97)
Monocytes Absolute: 0.2 10*3/uL (ref 0.1–0.9)
Monocytes: 7 %
Neutrophils Absolute: 2.3 10*3/uL (ref 1.4–7.0)
Neutrophils: 66 %
Platelets: 179 10*3/uL (ref 150–450)
RBC: 3.5 x10E6/uL — ABNORMAL LOW (ref 3.77–5.28)
RDW: 11.9 % (ref 11.7–15.4)
RPR Ser Ql: NONREACTIVE
Rh Factor: POSITIVE
Rubella Antibodies, IGG: 1.23 {index} (ref >0.99)
WBC: 3.5 10*3/uL (ref 3.4–10.8)

## 2023-10-07 LAB — HEMOGLOBIN A1C
Est. average glucose Bld gHb Est-mCnc: 108 mg/dL
Hgb A1c MFr Bld: 5.4 % (ref 4.8–5.6)

## 2023-10-07 LAB — COMPREHENSIVE METABOLIC PANEL
ALT: 26 [IU]/L (ref 0–32)
AST: 39 [IU]/L (ref 0–40)
Albumin: 3.5 g/dL — ABNORMAL LOW (ref 4.0–5.0)
Alkaline Phosphatase: 94 [IU]/L (ref 44–121)
BUN/Creatinine Ratio: 12 (ref 9–23)
BUN: 5 mg/dL — ABNORMAL LOW (ref 6–20)
Bilirubin Total: 0.2 mg/dL (ref 0.0–1.2)
CO2: 17 mmol/L — ABNORMAL LOW (ref 20–29)
Calcium: 8.8 mg/dL (ref 8.7–10.2)
Chloride: 104 mmol/L (ref 96–106)
Creatinine, Ser: 0.41 mg/dL — ABNORMAL LOW (ref 0.57–1.00)
Globulin, Total: 2.7 g/dL (ref 1.5–4.5)
Glucose: 92 mg/dL (ref 70–99)
Potassium: 3.9 mmol/L (ref 3.5–5.2)
Sodium: 136 mmol/L (ref 134–144)
Total Protein: 6.2 g/dL (ref 6.0–8.5)
eGFR: 136 mL/min/{1.73_m2} (ref >59)

## 2023-10-07 LAB — HCV INTERPRETATION

## 2023-10-08 NOTE — L&D Delivery Note (Addendum)
 OB/GYN Faculty Practice Delivery Note  Anita Herrera is a 28 y.o. [id number] s/p SVD at [redacted]w[redacted]d. She was admitted for SROM.   ROM: 11h 6m with clear fluid GBS Status: Positive, on PCN Maximum Maternal Temperature: 98.2  Labor Progress: Pt presented to MAU for leaking of fluid, had confirmed SROM and was admitted to labor and delivery. Initial exam 2.5cm. Patient's labor augmented w AROM of bulging bag of water and Pitocin  prior to delivery.   Delivery Date/Time: 03/05/24 @ 1936 Delivery: Called to room for deep variables and quick progression of labor. Fetal heart rate ~70s with contractions, unable to trace well, so FSE placed. Patient encouraged to push and reached complete and crowning. Head delivered LOA. 2 nuchal cords present and delivered through, they were then reduced at the perineum. Shoulder and body delivered in usual fashion. Infant with spontaneous cry, placed on mother's abdomen, dried and stimulated. Cord clamped x 2 after 1-minute delay, and cut by grandmother. Cord blood drawn. Placenta delivered spontaneously with gentle cord traction. Fundus firm with massage and Pitocin . Labia, perineum, vagina, and cervix were inspected. 1 small L labial tear repaired with suture. Lower uterine segment swept with removal of small amount of small clots. Hemostasis achieved and fundus firm.  Placenta: Complete, three vessel cord appreciated Complications: None Lacerations: Small left labial tear EBL: 416 mL Analgesia: Epidural  Infant:  APGAR (1 MIN): 8  APGAR (5 MINS): 9   Carey Chapman MD  Attestation of Supervision of Resident:  I confirm that I have verified the information documented in the resident's note and that I also was gloved and present for the entirety of the delivery and performed FSE placement and delivery of infant. Delivery of placenta by Dr. Fernand Howard under my supervision.  I have verified that all services and findings are accurately documented in this resident's note; and I  agree with management and plan as outlined in the documentation. I have also made any necessary editorial changes.   Melanie Spires, MD OB Fellow, Faculty Practice [facility], Center for [facility]

## 2023-10-10 LAB — URINE CULTURE, OB REFLEX

## 2023-10-10 LAB — CULTURE, OB URINE

## 2023-10-13 MED ORDER — CEPHALEXIN 500 MG PO CAPS: 500.0000 mg | ORAL_CAPSULE | Freq: Four times a day (QID) | ORAL | 2 refills | Status: DC

## 2023-10-23 ENCOUNTER — Encounter: Payer: Self-pay | Admitting: Obstetrics and Gynecology

## 2023-10-23 ENCOUNTER — Other Ambulatory Visit (HOSPITAL_COMMUNITY)
Admission: RE | Admit: 2023-10-23 | Discharge: 2023-10-23 | Disposition: A | Discharge status: Home / Self Care | Payer: Medicaid Other | Source: Ambulatory Visit | Attending: Obstetrics and Gynecology | Admitting: Obstetrics and Gynecology

## 2023-10-23 ENCOUNTER — Ambulatory Visit: Payer: Medicaid Other | Admitting: Obstetrics and Gynecology

## 2023-10-23 VITALS — BP 133/86 | HR 108 | Wt 293.8 lb

## 2023-10-23 DIAGNOSIS — Z8759 Personal history of other complications of pregnancy, childbirth and the puerperium: Secondary | ICD-10-CM | POA: Diagnosis not present

## 2023-10-23 DIAGNOSIS — O0990 Supervision of high risk pregnancy, unspecified, unspecified trimester: Secondary | ICD-10-CM | POA: Diagnosis present

## 2023-10-23 DIAGNOSIS — O99892 Other specified diseases and conditions complicating childbirth: Secondary | ICD-10-CM | POA: Diagnosis not present

## 2023-10-23 DIAGNOSIS — O99891 Other specified diseases and conditions complicating pregnancy: Secondary | ICD-10-CM

## 2023-10-23 DIAGNOSIS — O26892 Other specified pregnancy related conditions, second trimester: Secondary | ICD-10-CM

## 2023-10-23 DIAGNOSIS — O99210 Obesity complicating pregnancy, unspecified trimester: Secondary | ICD-10-CM | POA: Insufficient documentation

## 2023-10-23 DIAGNOSIS — O0992 Supervision of high risk pregnancy, unspecified, second trimester: Secondary | ICD-10-CM

## 2023-10-23 DIAGNOSIS — R8271 Bacteriuria: Secondary | ICD-10-CM

## 2023-10-23 DIAGNOSIS — R87612 Low grade squamous intraepithelial lesion on cytologic smear of cervix (LGSIL): Secondary | ICD-10-CM | POA: Diagnosis not present

## 2023-10-23 DIAGNOSIS — Z3A19 19 weeks gestation of pregnancy: Secondary | ICD-10-CM

## 2023-10-23 DIAGNOSIS — O0930 Supervision of pregnancy with insufficient antenatal care, unspecified trimester: Secondary | ICD-10-CM | POA: Insufficient documentation

## 2023-10-23 DIAGNOSIS — R12 Heartburn: Secondary | ICD-10-CM

## 2023-10-23 MED ORDER — FAMOTIDINE 20 MG PO TABS
20.0000 mg | ORAL_TABLET | Freq: Every day | ORAL | 3 refills | Status: DC
Start: 2023-10-23 — End: 2023-11-19

## 2023-10-23 MED ORDER — ASPIRIN 81 MG PO TBEC: 162.0000 mg | DELAYED_RELEASE_TABLET | Freq: Every day | ORAL | 12 refills | Status: DC

## 2023-10-23 NOTE — Patient Instructions (Signed)
Excedrine tension Magnesium oxide nightly                    Safe Medications in Pregnancy    Acne: Benzoyl Peroxide Salicylic Acid  Backache/Headache: Tylenol: 2 regular strength every 4 hours OR              2 Extra strength every 6 hours  Colds/Coughs/Allergies: Benadryl (alcohol free) 25 mg every 6 hours as needed Breath right strips Claritin Cepacol throat lozenges Chloraseptic throat spray Cold-Eeze- up to three times per day Cough drops, alcohol free Flonase (by prescription only) Guaifenesin Mucinex Robitussin DM (plain only, alcohol free) Saline nasal spray/drops Sudafed (pseudoephedrine) & Actifed ** use only after [redacted] weeks gestation and if you do not have high blood pressure Tylenol Vicks Vaporub Zinc lozenges Zyrtec   Constipation: Colace Ducolax suppositories Fleet enema Glycerin suppositories Metamucil Milk of magnesia Miralax Senokot Smooth move tea  Diarrhea: Kaopectate Imodium A-D  *NO pepto Bismol  Hemorrhoids: Anusol Anusol HC Preparation H Tucks  Indigestion: Tums Maalox Mylanta Zantac  Pepcid  Insomnia: Benadryl (alcohol free) 25mg  every 6 hours as needed Tylenol PM Unisom, no Gelcaps  Leg Cramps: Tums MagGel  Nausea/Vomiting:  Bonine Dramamine Emetrol Ginger extract Sea bands Meclizine  Nausea medication to take during pregnancy:  Unisom (doxylamine succinate 25 mg tablets) Take one tablet daily at bedtime. If symptoms are not adequately controlled, the dose can be increased to a maximum recommended dose of two tablets daily (1/2 tablet in the morning, 1/2 tablet mid-afternoon and one at bedtime). Vitamin B6 100mg  tablets. Take one tablet twice a day (up to 200 mg per day).  Skin Rashes: Aveeno products Benadryl cream or 25mg  every 6 hours as needed Calamine Lotion 1% cortisone cream  Yeast infection: Gyne-lotrimin 7 Monistat 7   **If taking multiple medications, please check labels to avoid  duplicating the same active ingredients **take medication as directed on the label ** Do not exceed 4000 mg of tylenol in 24 hours **Do not take medications that contain aspirin or ibuprofen

## 2023-10-23 NOTE — Progress Notes (Signed)
INITIAL PRENATAL VISIT  Subjective:   Anita Herrera is being seen today for her first obstetrical visit. She is at [redacted]w[redacted]d gestation by LMP.  Her obstetrical history is significant for  hx LGA, shoulder dystocia, hx GTN, hx ascus/lsil . Relationship with FOB: significant other, living together. Patient does intend to breast feed. Pregnancy history fully reviewed.  Patient reports  heartburn, not relieved by tums, had to use pepcid last pregnancy. She has not been able to pick up abx for  uti, will pick up today.   Objective:    Obstetric History OB History  Gravida Para Term Preterm AB Living  8 3 3  0 4 3  SAB IAB Ectopic Multiple Live Births  1 3 0 0 3    # Outcome Date GA Lbr Len/2nd Weight Sex Type Anes PTL Lv  8 Current           7 Term 08/07/22 [redacted]w[redacted]d 05:00 / 00:05 8 lb 6.4 oz (3.81 kg) F Vag-Spont EPI  LIV  6 IAB 2022          5 IAB 2021          4 IAB 2020          3 SAB 2019          2 Term 10/07/15 [redacted]w[redacted]d 08:45 / 00:03 9 lb 5.2 oz (4.23 kg) F Vag-Spont EPI  LIV     Birth Comments: wnl  1 Term 04/19/14 [redacted]w[redacted]d 10:00 / 00:17 8 lb 11 oz (3.941 kg) F Vag-Spont EPI  LIV    Past Medical History:  Diagnosis Date   Anemia    Shingles    Vaginitis     Past Surgical History:  Procedure Laterality Date   WISDOM TOOTH EXTRACTION      Current Outpatient Medications on File Prior to Visit  Medication Sig Dispense Refill   Prenatal 28-0.8 MG TABS Take 1 tablet by mouth daily. 30 tablet 11   cephALEXin (KEFLEX) 500 MG capsule Take 1 capsule (500 mg total) by mouth 4 (four) times daily. (Patient not taking: Reported on 10/23/2023) 28 capsule 2   mupirocin ointment (BACTROBAN) 2 % Apply 1 Application topically 2 (two) times daily. (Patient not taking: Reported on 10/23/2023) 22 g 0   No current facility-administered medications on file prior to visit.    No Known Allergies  Social History:  reports that she quit smoking about 3 months ago. Her smoking use included  cigarettes. She has never used smokeless tobacco. She reports that she does not currently use alcohol after a past usage of about 2.0 standard drinks of alcohol per week. She reports that she does not use drugs.  Family History  Problem Relation Age of Onset   Diabetes Mother    Hypertension Maternal Grandmother    Asthma Neg Hx    Cancer Neg Hx    Birth defects Neg Hx    Heart disease Neg Hx    Stroke Neg Hx     The following portions of the patient's history were reviewed and updated as appropriate: allergies, current medications, past family history, past medical history, past social history, past surgical history and problem list.  Review of Systems Review of Systems  All other systems reviewed and are negative.     Physical Exam:  BP (!) 142/87   Pulse (!) 106   Wt 293 lb 12.8 oz (133.3 kg)   LMP 06/06/2023   BMI 46.02 kg/m  CONSTITUTIONAL: Well-developed, well-nourished female  in no acute distress.  HENT:  Normocephalic, atraumatic.  EYES: Conjunctivae normal. No scleral icterus.  NECK: Normal range of motion SKIN: Skin is warm and dry. No rash noted.  MUSCULOSKELETAL: Normal range of motion. NEUROLOGIC: Alert and oriented  PSYCHIATRIC: Normal mood and affect.  CARDIOVASCULAR: Normal heart rate noted RESPIRATORY: normal effort ABDOMEN: Soft PELVIC: Normal appearing external genitalia; normal appearing vaginal mucosa and cervix.  No abnormal discharge noted.  Pap smear obtained.    Fetal Heart Rate (bpm): 142   Movement: Present       Assessment:    Pregnancy: [id number]  1. Supervision of high risk pregnancy, antepartum (Primary) BP and FHR normal Doing well, feeling regular movement  Desires postplacental IUD  - Cervicovaginal ancillary only( [place]) - Cytology - PAP( [place]) - aspirin EC 81 MG tablet; Take 2 tablets (162 mg total) by mouth daily. Swallow whole.  Dispense: 60 tablet; Refill: 12 - famotidine (PEPCID) 20 MG tablet; Take 1 tablet (20  mg total) by mouth at bedtime.  Dispense: 30 tablet; Refill: 3  2. History of gestational hypertension Cmp at intake Encouraged bp check, discussed when to follow up or go to MAU  Discussed recommendation for ASA, rx sent for 162mg  - Protein / creatinine ratio, urine  3. History of shoulder dystocia in prior pregnancy-LGA Second baby was LGA and had shoulder dystocia, third delivery no problems   4. Asymptomatic bacteriuria during pregnancy Abx already sent, to start today. Follow up precautions given   5. 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 Heartburn during pregnancy in second trimester Pepcid sent    Initial labs drawn. Prenatal vitamins. Problem list reviewed and updated. Reviewed in detail the nature of the practice with collaborative care between  Genetic screening discussed: NIPS/First trimester screen/Quad/AFP declined. Role of ultrasound in pregnancy discussed; Anatomy US: ordered.  Follow up in 4 weeks. Discussed clinic routines, schedule of care and testing, genetic screening options, involvement of students and residents under the direct supervision of APPs and doctors and presence of female providers. Pt verbalized understanding.  Return in 4 weeks for routine prenatal Future Appointments  Date Time Provider Department Center  10/30/2023  2:15 PM [facility] NURSE [facility]  10/30/2023  2:30 PM [facility]  11/20/2023  1:30 PM Warden Fillers, MD [facility] None      Sue Lush, FNP

## 2023-10-23 NOTE — Progress Notes (Signed)
Pt presents for ROB visit. Declines genetic testing

## 2023-10-27 LAB — CERVICOVAGINAL ANCILLARY ONLY
Bacterial Vaginitis (gardnerella): POSITIVE — AB
Candida Glabrata: NEGATIVE
Candida Vaginitis: NEGATIVE
Chlamydia: NEGATIVE
Comment: NEGATIVE
Comment: NEGATIVE
Comment: NEGATIVE
Comment: NEGATIVE
Comment: NORMAL
Neisseria Gonorrhea: NEGATIVE

## 2023-10-27 MED ORDER — METRONIDAZOLE 0.75 % VA GEL
1.0000 | Freq: Every day | VAGINAL | 1 refills | Status: DC
Start: 2023-10-27 — End: 2023-12-17

## 2023-10-27 NOTE — Addendum Note (Signed)
Addended by: Sue Lush on: 10/27/2023 03:56 PM   Modules accepted: Orders

## 2023-10-30 ENCOUNTER — Ambulatory Visit (HOSPITAL_BASED_OUTPATIENT_CLINIC_OR_DEPARTMENT_OTHER): Payer: Medicaid Other

## 2023-10-30 ENCOUNTER — Encounter: Payer: Self-pay | Admitting: *Deleted

## 2023-10-30 ENCOUNTER — Ambulatory Visit: Payer: Medicaid Other | Admitting: *Deleted

## 2023-10-30 ENCOUNTER — Other Ambulatory Visit: Payer: Self-pay

## 2023-10-30 ENCOUNTER — Ambulatory Visit: Payer: Medicaid Other | Attending: Obstetrics and Gynecology

## 2023-10-30 VITALS — BP 148/67 | HR 90

## 2023-10-30 DIAGNOSIS — O99210 Obesity complicating pregnancy, unspecified trimester: Secondary | ICD-10-CM | POA: Diagnosis present

## 2023-10-30 DIAGNOSIS — O09292 Supervision of pregnancy with other poor reproductive or obstetric history, second trimester: Secondary | ICD-10-CM | POA: Diagnosis not present

## 2023-10-30 DIAGNOSIS — O99212 Obesity complicating pregnancy, second trimester: Secondary | ICD-10-CM | POA: Diagnosis present

## 2023-10-30 DIAGNOSIS — E669 Obesity, unspecified: Secondary | ICD-10-CM

## 2023-10-30 DIAGNOSIS — O0990 Supervision of high risk pregnancy, unspecified, unspecified trimester: Secondary | ICD-10-CM | POA: Insufficient documentation

## 2023-10-30 DIAGNOSIS — O0932 Supervision of pregnancy with insufficient antenatal care, second trimester: Secondary | ICD-10-CM

## 2023-10-30 DIAGNOSIS — Z3A20 20 weeks gestation of pregnancy: Secondary | ICD-10-CM | POA: Diagnosis present

## 2023-10-30 DIAGNOSIS — Z8759 Personal history of other complications of pregnancy, childbirth and the puerperium: Secondary | ICD-10-CM | POA: Insufficient documentation

## 2023-10-30 LAB — CYTOLOGY - PAP: Diagnosis: NEGATIVE

## 2023-10-30 NOTE — Progress Notes (Signed)
C/O " occ H/A, dizziness, spots, epigastric pain."

## 2023-10-30 NOTE — Progress Notes (Signed)
MFM Consult Note  Anita Herrera is currently at 20 weeks and 5 days.  She was seen due to maternal obesity with a BMI of 42 and late presentation for prenatal care.  She denies any significant past medical history and denies any problems in her current pregnancy.    As she presented late for prenatal care, she has not had any screening tests for fetal aneuploidy drawn in her current pregnancy.  Based on the fetal biometry measurements obtained today, her [facility] is March 13, 2024, making her 20 weeks and 5 days pregnant today.  There were no obvious fetal anomalies noted on today's ultrasound exam.  However, today's exam was limited due to the fetal position and maternal body habitus.    The limitations of ultrasound in the detection of all anomalies was discussed.  Due to maternal obesity, we will continue to follow her with growth ultrasounds throughout her pregnancy.    Weekly fetal testing should be started at around 34 weeks.    As she has not had a screening test for fetal aneuploidy drawn, she was offered and declined a cell free DNA test today.  She understands that an amniocentesis is also available for definitive diagnosis of fetal chromosomal abnormalities.    A follow-up exam was scheduled in 4 weeks to assess the fetal growth and to complete the views of the fetal anatomy.    The patient stated that all of her questions were answered today.  A total of 30 minutes was spent counseling and coordinating the care for this patient.  Greater than 50% of the time was spent in direct face-to-face contact.

## 2023-10-31 ENCOUNTER — Other Ambulatory Visit: Payer: Self-pay | Admitting: *Deleted

## 2023-10-31 DIAGNOSIS — O99212 Obesity complicating pregnancy, second trimester: Secondary | ICD-10-CM

## 2023-11-05 ENCOUNTER — Telehealth: Payer: Self-pay

## 2023-11-05 NOTE — Telephone Encounter (Signed)
S/w pt after babyscripts alert of 157/81. Pt states that she is currently at work and no longer having any abnormal symptoms. Pt states that she gets off of work at 3 and will retake the BP then and let us know. Advised if abnormal symptoms return be evaluated at [facility], pt agreed.

## 2023-11-19 ENCOUNTER — Ambulatory Visit (INDEPENDENT_AMBULATORY_CARE_PROVIDER_SITE_OTHER): Payer: Medicaid Other | Admitting: Obstetrics and Gynecology

## 2023-11-19 VITALS — BP 131/80 | HR 85 | Wt 287.2 lb

## 2023-11-19 DIAGNOSIS — Z3A23 23 weeks gestation of pregnancy: Secondary | ICD-10-CM | POA: Diagnosis not present

## 2023-11-19 DIAGNOSIS — R12 Heartburn: Secondary | ICD-10-CM

## 2023-11-19 DIAGNOSIS — Z6841 Body Mass Index (BMI) 40.0 and over, adult: Secondary | ICD-10-CM

## 2023-11-19 DIAGNOSIS — Z8759 Personal history of other complications of pregnancy, childbirth and the puerperium: Secondary | ICD-10-CM | POA: Diagnosis not present

## 2023-11-19 DIAGNOSIS — O26892 Other specified pregnancy related conditions, second trimester: Secondary | ICD-10-CM

## 2023-11-19 DIAGNOSIS — O0990 Supervision of high risk pregnancy, unspecified, unspecified trimester: Secondary | ICD-10-CM | POA: Diagnosis not present

## 2023-11-19 DIAGNOSIS — O2342 Unspecified infection of urinary tract in pregnancy, second trimester: Secondary | ICD-10-CM

## 2023-11-19 MED ORDER — FAMOTIDINE 20 MG PO TABS: 20.0000 mg | ORAL_TABLET | Freq: Two times a day (BID) | ORAL | 3 refills | Status: DC

## 2023-11-19 NOTE — Progress Notes (Signed)
   PRENATAL VISIT NOTE  Subjective:  Anita Herrera is a 28 y.o. [phone] at [redacted]w[redacted]d being seen today for ongoing prenatal care.  She is currently monitored for the following issues for this high-risk pregnancy and has LGSIL of cervix of undetermined significance; History of shoulder dystocia in prior pregnancy-LGA; Supervision of high risk pregnancy, antepartum; Late prenatal care affecting pregnancy; Obesity affecting pregnancy, antepartum; and History of gestational hypertension on their problem list.  Patient reports  cloudy urine with odor .  Contractions: Not present. Vag. Bleeding: None.  Movement: Present. Denies leaking of fluid.   The following portions of the patient's history were reviewed and updated as appropriate: allergies, current medications, past family history, past medical history, past social history, past surgical history and problem list.   Objective:   Vitals:   11/19/23 0938 11/19/23 0948  BP: (!) 150/83 131/80  Pulse: 85   Weight: 287 lb 3.2 oz (130.3 kg)     Fetal Status: Fetal Heart Rate (bpm): 134   Movement: Present     General:  Alert, oriented and cooperative. Patient is in no acute distress.  Skin: Skin is warm and dry. No rash noted.   Cardiovascular: Normal heart rate noted  Respiratory: Normal respiratory effort, no problems with respiration noted  Abdomen: Soft, gravid, appropriate for gestational age.  Pain/Pressure: Absent     Pelvic: Cervical exam deferred        Extremities: Normal range of motion.  Edema: None  Mental Status: Normal mood and affect. Normal behavior. Normal judgment and thought content.   Assessment and Plan:  Pregnancy: [name] at [redacted]w[redacted]d 1. Supervision of high risk pregnancy, antepartum (Primary) FHR normal Doing well, feeling regular movement    2. History of gestational hypertension Continue ASA, normotensive on repeat  Precautions discussed when to follow up or go to MAU Encouraged taking BPs at home   3. History  of shoulder dystocia in prior pregnancy-LGA  4. [redacted] weeks gestation of pregnancy Anticipatory guidance regarding GTT next visit, instructed on NPO after midnight   5. BMI 40.0-44.9, adult (HCC) 1/23 u/s EFW 67%, afi normal  Follow up 2/24 Fetal testing to start 34 weeks  6. Urinary tract infection in mother during second trimester of pregnancy Checking culture today   Preterm labor symptoms and general obstetric precautions including but not limited to vaginal bleeding, contractions, leaking of fluid and fetal movement were reviewed in detail with the patient. Please refer to After Visit Summary for other counseling recommendations.   Return in about 4 weeks (around 12/17/2023) for OB VISIT (MD or APP), 2 hr GTT.  Future Appointments  Date Time Provider Department Center  12/01/2023 11:30 AM [facility] US4 [facility]    Albertine Grates, FNP

## 2023-11-19 NOTE — Progress Notes (Signed)
Does have slight swelling in ankles after on feet for period of time.

## 2023-11-20 ENCOUNTER — Encounter: Payer: Medicaid Other | Admitting: Obstetrics and Gynecology

## 2023-11-24 LAB — URINE CULTURE

## 2023-11-24 MED ORDER — CEFADROXIL 500 MG PO CAPS: 500.0000 mg | ORAL_CAPSULE | Freq: Two times a day (BID) | ORAL | 0 refills | Status: DC

## 2023-11-24 NOTE — Addendum Note (Signed)
 Addended by: Sue Lush on: 11/24/2023 08:12 AM   Modules accepted: Orders

## 2023-12-01 ENCOUNTER — Ambulatory Visit (HOSPITAL_BASED_OUTPATIENT_CLINIC_OR_DEPARTMENT_OTHER): Payer: Medicaid Other | Admitting: Obstetrics

## 2023-12-01 ENCOUNTER — Other Ambulatory Visit: Payer: Self-pay | Admitting: *Deleted

## 2023-12-01 ENCOUNTER — Ambulatory Visit: Payer: Medicaid Other | Attending: Obstetrics

## 2023-12-01 ENCOUNTER — Other Ambulatory Visit: Payer: Self-pay

## 2023-12-01 VITALS — BP 154/71

## 2023-12-01 DIAGNOSIS — O0930 Supervision of pregnancy with insufficient antenatal care, unspecified trimester: Secondary | ICD-10-CM

## 2023-12-01 DIAGNOSIS — Z3A25 25 weeks gestation of pregnancy: Secondary | ICD-10-CM

## 2023-12-01 DIAGNOSIS — O0932 Supervision of pregnancy with insufficient antenatal care, second trimester: Secondary | ICD-10-CM | POA: Diagnosis not present

## 2023-12-01 DIAGNOSIS — O09292 Supervision of pregnancy with other poor reproductive or obstetric history, second trimester: Secondary | ICD-10-CM

## 2023-12-01 DIAGNOSIS — O0990 Supervision of high risk pregnancy, unspecified, unspecified trimester: Secondary | ICD-10-CM | POA: Diagnosis present

## 2023-12-01 DIAGNOSIS — O99212 Obesity complicating pregnancy, second trimester: Secondary | ICD-10-CM | POA: Insufficient documentation

## 2023-12-01 DIAGNOSIS — E669 Obesity, unspecified: Secondary | ICD-10-CM | POA: Diagnosis not present

## 2023-12-01 DIAGNOSIS — O99210 Obesity complicating pregnancy, unspecified trimester: Secondary | ICD-10-CM

## 2023-12-01 NOTE — Progress Notes (Signed)
 MFM Consult Note  Anita Herrera is currently at 25 weeks and 2 days.  She has been followed due to maternal obesity with a BMI of 42.  She denies any problems since her last exam.    She has declined all screening tests for fetal aneuploidy in her current pregnancy.  She was informed that the fetal growth and amniotic fluid level appears appropriate for her gestational age.  The fetal biometry measurements obtained today are consistent with an [facility] of March 13, 2024.  The views of the fetal anatomy were visualized today.  There were no obvious fetal anomalies noted.    The limitations of ultrasound in the detection of all anomalies was discussed.    Due to maternal obesity, we will continue to follow her with growth ultrasounds throughout her pregnancy.    Weekly fetal testing should be started at around 34 weeks.    She will return in 4 weeks for another ultrasound exam.    The patient stated that all of her questions were answered today.  A total of 20 minutes was spent counseling and coordinating the care for this patient.  Greater than 50% of the time was spent in direct face-to-face contact.

## 2023-12-17 ENCOUNTER — Ambulatory Visit: Payer: Medicaid Other

## 2023-12-17 ENCOUNTER — Ambulatory Visit (INDEPENDENT_AMBULATORY_CARE_PROVIDER_SITE_OTHER): Payer: Medicaid Other | Admitting: Obstetrics and Gynecology

## 2023-12-17 VITALS — BP 129/83 | HR 111 | Wt 288.0 lb

## 2023-12-17 DIAGNOSIS — Z3A27 27 weeks gestation of pregnancy: Secondary | ICD-10-CM | POA: Diagnosis not present

## 2023-12-17 DIAGNOSIS — O99210 Obesity complicating pregnancy, unspecified trimester: Secondary | ICD-10-CM | POA: Diagnosis not present

## 2023-12-17 DIAGNOSIS — Z8759 Personal history of other complications of pregnancy, childbirth and the puerperium: Secondary | ICD-10-CM

## 2023-12-17 DIAGNOSIS — O0990 Supervision of high risk pregnancy, unspecified, unspecified trimester: Secondary | ICD-10-CM

## 2023-12-17 NOTE — Progress Notes (Signed)
   PRENATAL VISIT NOTE  Subjective:  Anita Herrera is a 28 y.o. [phone] at [redacted]w[redacted]d being seen today for ongoing prenatal care.  She is currently monitored for the following issues for this high-risk pregnancy and has LGSIL of cervix of undetermined significance; History of shoulder dystocia in prior pregnancy-LGA; Supervision of high risk pregnancy, antepartum; Late prenatal care affecting pregnancy; Obesity affecting pregnancy, antepartum; and History of gestational hypertension on their problem list.  Patient doing well with no acute concerns today. She reports no complaints.  Contractions: Not present. Vag. Bleeding: None.  Movement: Present. Denies leaking of fluid.   The following portions of the patient's history were reviewed and updated as appropriate: allergies, current medications, past family history, past medical history, past social history, past surgical history and problem list. Problem list updated.  Objective:   Vitals:   12/17/23 1025  BP: 129/83  Pulse: (!) 111  Weight: 288 lb (130.6 kg)    Fetal Status: Fetal Heart Rate (bpm): 140 Fundal Height: 27 cm Movement: Present     General:  Alert, oriented and cooperative. Patient is in no acute distress.  Skin: Skin is warm and dry. No rash noted.   Cardiovascular: Normal heart rate noted  Respiratory: Normal respiratory effort, no problems with respiration noted  Abdomen: Soft, gravid, appropriate for gestational age.  Pain/Pressure: Absent     Pelvic: Cervical exam deferred        Extremities: Normal range of motion.     Mental Status:  Normal mood and affect. Normal behavior. Normal judgment and thought content.   Assessment and Plan:  Pregnancy: [id number] at [redacted]w[redacted]d  1. [redacted] weeks gestation of pregnancy (Primary)   2. Supervision of high risk pregnancy, antepartum Continue routine prenatal care - Culture, OB Urine  3. Obesity affecting pregnancy, antepartum, unspecified obesity type   4. History of shoulder  dystocia in prior pregnancy-LGA Current EFW is 44%  5. History of gestational hypertension BP currently normal, no antihypertensives  Preterm labor symptoms and general obstetric precautions including but not limited to vaginal bleeding, contractions, leaking of fluid and fetal movement were reviewed in detail with the patient.  Please refer to After Visit Summary for other counseling recommendations.   Return in about 2 weeks (around 12/31/2023) for in person, [facility].   Mariel Aloe, MD Faculty Attending Center for [facility]

## 2023-12-17 NOTE — Progress Notes (Signed)
 Pt will leave urine sample today for TOC UTI.  Pt states symptoms better still having cloudy urine. Pt states she has multiple uti's with last pregnancy.  Pt has u/s follow up this month

## 2023-12-18 LAB — CBC
Hematocrit: 31.3 % — ABNORMAL LOW (ref 34.0–46.6)
Hemoglobin: 10.3 g/dL — ABNORMAL LOW (ref 11.1–15.9)
MCH: 31.2 pg (ref 26.6–33.0)
MCHC: 32.9 g/dL (ref 31.5–35.7)
MCV: 95 fL (ref 79–97)
Platelets: 166 10*3/uL (ref 150–450)
RBC: 3.3 x10E6/uL — ABNORMAL LOW (ref 3.77–5.28)
RDW: 12.7 % (ref 11.7–15.4)
WBC: 7.4 10*3/uL (ref 3.4–10.8)

## 2023-12-18 LAB — RPR: RPR Ser Ql: NONREACTIVE

## 2023-12-18 LAB — GLUCOSE TOLERANCE, 2 HOURS W/ 1HR
Glucose, 1 hour: 175 mg/dL (ref 70–179)
Glucose, 2 hour: 116 mg/dL (ref 70–152)
Glucose, Fasting: 81 mg/dL (ref 70–91)

## 2023-12-18 LAB — HIV ANTIBODY (ROUTINE TESTING W REFLEX): HIV Screen 4th Generation wRfx: NONREACTIVE

## 2023-12-19 ENCOUNTER — Encounter (HOSPITAL_COMMUNITY): Payer: Self-pay | Admitting: Obstetrics and Gynecology

## 2023-12-19 ENCOUNTER — Other Ambulatory Visit: Payer: Self-pay | Admitting: *Deleted

## 2023-12-19 ENCOUNTER — Telehealth: Payer: Self-pay | Admitting: *Deleted

## 2023-12-19 ENCOUNTER — Inpatient Hospital Stay (HOSPITAL_COMMUNITY)
Admission: AD | Admit: 2023-12-19 | Discharge: 2023-12-19 | Disposition: A | Discharge status: Home / Self Care | Attending: Obstetrics and Gynecology | Admitting: Obstetrics and Gynecology

## 2023-12-19 DIAGNOSIS — N390 Urinary tract infection, site not specified: Secondary | ICD-10-CM | POA: Diagnosis not present

## 2023-12-19 DIAGNOSIS — Z3689 Encounter for other specified antenatal screening: Secondary | ICD-10-CM

## 2023-12-19 DIAGNOSIS — O2342 Unspecified infection of urinary tract in pregnancy, second trimester: Secondary | ICD-10-CM | POA: Insufficient documentation

## 2023-12-19 DIAGNOSIS — Z79899 Other long term (current) drug therapy: Secondary | ICD-10-CM | POA: Insufficient documentation

## 2023-12-19 DIAGNOSIS — O10919 Unspecified pre-existing hypertension complicating pregnancy, unspecified trimester: Secondary | ICD-10-CM | POA: Insufficient documentation

## 2023-12-19 DIAGNOSIS — Z3A27 27 weeks gestation of pregnancy: Secondary | ICD-10-CM | POA: Insufficient documentation

## 2023-12-19 DIAGNOSIS — Z7982 Long term (current) use of aspirin: Secondary | ICD-10-CM | POA: Diagnosis not present

## 2023-12-19 DIAGNOSIS — O0990 Supervision of high risk pregnancy, unspecified, unspecified trimester: Secondary | ICD-10-CM

## 2023-12-19 DIAGNOSIS — O10912 Unspecified pre-existing hypertension complicating pregnancy, second trimester: Secondary | ICD-10-CM | POA: Diagnosis present

## 2023-12-19 LAB — URINALYSIS, ROUTINE W REFLEX MICROSCOPIC
Bilirubin Urine: NEGATIVE
Glucose, UA: NEGATIVE mg/dL
Ketones, ur: NEGATIVE mg/dL
Nitrite: POSITIVE — AB
Protein, ur: NEGATIVE mg/dL
Specific Gravity, Urine: 1.025 (ref 1.005–1.030)
pH: 6 (ref 5.0–8.0)

## 2023-12-19 LAB — COMPREHENSIVE METABOLIC PANEL
ALT: 19 U/L (ref 0–44)
AST: 19 U/L (ref 15–41)
Albumin: 2.7 g/dL — ABNORMAL LOW (ref 3.5–5.0)
Alkaline Phosphatase: 59 U/L (ref 38–126)
Anion gap: 4 — ABNORMAL LOW (ref 5–15)
BUN: <5 mg/dL — ABNORMAL LOW (ref 6–20)
CO2: 22 mmol/L (ref 22–32)
Calcium: 8.5 mg/dL — ABNORMAL LOW (ref 8.9–10.3)
Chloride: 110 mmol/L (ref 98–111)
Creatinine, Ser: 0.44 mg/dL (ref 0.44–1.00)
GFR, Estimated: >60 mL/min (ref >60)
Glucose, Bld: 78 mg/dL (ref 70–99)
Potassium: 3.8 mmol/L (ref 3.5–5.1)
Sodium: 136 mmol/L (ref 135–145)
Total Bilirubin: 0.3 mg/dL (ref 0.0–1.2)
Total Protein: 6.2 g/dL — ABNORMAL LOW (ref 6.5–8.1)

## 2023-12-19 LAB — CBC
HCT: 30.4 % — ABNORMAL LOW (ref 36.0–46.0)
Hemoglobin: 10.1 g/dL — ABNORMAL LOW (ref 12.0–15.0)
MCH: 31.5 pg (ref 26.0–34.0)
MCHC: 33.2 g/dL (ref 30.0–36.0)
MCV: 94.7 fL (ref 80.0–100.0)
Platelets: 160 10*3/uL (ref 150–400)
RBC: 3.21 MIL/uL — ABNORMAL LOW (ref 3.87–5.11)
RDW: 13.1 % (ref 11.5–15.5)
WBC: 6.7 10*3/uL (ref 4.0–10.5)
nRBC: 0 % (ref 0.0–0.2)

## 2023-12-19 LAB — URINALYSIS, MICROSCOPIC (REFLEX)

## 2023-12-19 MED ORDER — CEFADROXIL 500 MG PO CAPS: 500.0000 mg | ORAL_CAPSULE | Freq: Two times a day (BID) | ORAL | 0 refills | Status: DC

## 2023-12-19 MED ORDER — NIFEDIPINE ER 30 MG PO TB24: 30.0000 mg | ORAL_TABLET | Freq: Every day | ORAL | 0 refills | Status: DC

## 2023-12-19 MED ORDER — NIFEDIPINE ER OSMOTIC RELEASE 30 MG PO TB24
30.0000 mg | ORAL_TABLET | Freq: Every day | ORAL | Status: DC
  Administered 2023-12-19: 30 mg via ORAL
  Filled 2023-12-19: qty 1

## 2023-12-19 MED ORDER — AMOXICILLIN-POT CLAVULANATE 875-125 MG PO TABS: 1.0000 | ORAL_TABLET | Freq: Two times a day (BID) | ORAL | 0 refills | Status: AC

## 2023-12-19 NOTE — MAU Note (Signed)
.  Anita Herrera is a 28 y.o. at [redacted]w[redacted]d here in MAU reporting: felt a little  SOB this morning and and some blurred vision. Sat down and tried to rest for about 15 min and then took her b/p and it was 167/98. Called office and they told her to come in . Recheck b/p at home before coming and it was 152/95. Pt still having some blurred vision .  Denies any headache or abd pain . Good fetal movement reported.   LMP:  Onset of complaint: this morning Pain score: 0 There were no vitals filed for this visit.   FHT: 135  Lab orders placed from triage: u/a

## 2023-12-19 NOTE — MAU Provider Note (Addendum)
 Chief Complaint:  Hypertension   HPI  March 14th, 2025 / 10:08 AM First Provider Initiated Contact with Patient 12/19/23 1009      Anita Herrera is a 28 y.o. [id number] at [redacted]w[redacted]d who presents to maternity admissions reporting feeling SOB with visual disturbances while getting ready this am. She also feels fullness in her head when these spells happen but not any headaches. Has a history of headaches but those abated after she started low dose aspirin daily.   She checked her blood pressure twice this am and found them to be elevated, which urged her to call her OBGYN office. They advised her to come to MAU. She has been experiencing similar episodes for several weeks now.  Pregnancy Course: Receives care at [facility]. Prenatal records were reviewed revealing multiple hypertensive blood pressure levels prior to [redacted] wks gestation.  Past Medical History:  Diagnosis Date   Anemia    Shingles    Vaginitis    OB History  Gravida Para Term Preterm AB Living  8 3 3  0 4 3  SAB IAB Ectopic Multiple Live Births  1 3 0 0 3    # Outcome Date GA Lbr Len/2nd Weight Sex Type Anes PTL Lv  8 Current           7 Term 08/07/22 [redacted]w[redacted]d 05:00 / 00:05 3810 g F Vag-Spont EPI  LIV  6 IAB 2022          5 IAB 2021          4 IAB 2020          3 SAB 2019          2 Term 10/07/15 [redacted]w[redacted]d 08:45 / 00:03 4230 g F Vag-Spont EPI  LIV     Birth Comments: wnl  1 Term 04/19/14 [redacted]w[redacted]d 10:00 / 00:17 3941 g F Vag-Spont EPI  LIV   Past Surgical History:  Procedure Laterality Date   WISDOM TOOTH EXTRACTION     Family History  Problem Relation Age of Onset   Diabetes Mother    Hypertension Maternal Grandmother    Asthma Neg Hx    Cancer Neg Hx    Birth defects Neg Hx    Heart disease Neg Hx    Stroke Neg Hx    Social History   Tobacco Use   Smoking status: Former    Current packs/day: 0.00    Types: Cigarettes    Quit date: 07/2023    Years since quitting: 0.4   Smokeless tobacco: Never  Vaping Use   Vaping  status: Never Used  Substance Use Topics   Alcohol use: Not Currently    Alcohol/week: 2.0 standard drinks of alcohol    Types: 2 Shots of liquor per week    Comment: Not since confirmed pregnancy   Drug use: No   No Known Allergies No medications prior to admission.   I have reviewed patient's Past Medical Hx, Surgical Hx, Family Hx, Social Hx, medications and allergies.   ROS  Pertinent items noted in HPI and remainder of comprehensive ROS otherwise negative.   PHYSICAL EXAM  Patient Vitals for the past 24 hrs:  BP Temp Pulse Resp Height Weight  12/19/23 1215 122/70 -- 89 -- -- --  12/19/23 1200 (!) 113/58 -- 88 -- -- --  12/19/23 1145 128/67 -- 87 -- -- --  12/19/23 1130 135/63 -- 85 -- -- --  12/19/23 1115 128/71 -- 87 -- -- --  12/19/23 1100 132/67 --  85 -- -- --  12/19/23 1045 135/73 -- 87 -- -- --  12/19/23 1030 134/72 -- 81 -- -- --  12/19/23 1000 138/77 -- 94 -- -- --  12/19/23 0945 (!) 146/72 -- 98 -- -- --  12/19/23 0941 (!) 147/83 (!) 97.5 F (36.4 C) 100 18 -- --  12/19/23 0923 -- -- -- -- 5\' 7"  (1.702 m) 132.9 kg   Constitutional: Well-developed, well-nourished female in no acute distress.  Cardiovascular: normal rate & rhythm, warm and well-perfused Respiratory: normal effort, no problems with respiration noted GI: Abd soft, non-tender, non-distended MS: Extremities nontender, no edema, normal ROM Neurologic: Alert and oriented x 4.  GU: no CVA tenderness    Fetal Tracing: Reactive Baseline: 125 bpm Variability: 6-25 bpm Accelerations: 15x15 Decelerations: None Toco: none   Labs: Results for orders placed or performed during the hospital encounter of 12/19/23 (from the past 24 hours)  Urinalysis, Routine w reflex microscopic -Urine, Clean Catch     Status: Abnormal   Collection Time: 12/19/23  9:50 AM  Result Value Ref Range   Color, Urine YELLOW YELLOW   APPearance CLOUDY (A) CLEAR   Specific Gravity, Urine 1.025 1.005 - 1.030   pH 6.0 5.0 - 8.0    Glucose, UA NEGATIVE NEGATIVE mg/dL   Hgb urine dipstick SMALL (A) NEGATIVE   Bilirubin Urine NEGATIVE NEGATIVE   Ketones, ur NEGATIVE NEGATIVE mg/dL   Protein, ur NEGATIVE NEGATIVE mg/dL   Nitrite POSITIVE (A) NEGATIVE   Leukocytes,Ua TRACE (A) NEGATIVE  Urinalysis, Microscopic (reflex)     Status: Abnormal   Collection Time: 12/19/23  9:50 AM  Result Value Ref Range   RBC / HPF 0-5 0 - 5 RBC/hpf   WBC, UA 6-10 0 - 5 WBC/hpf   Bacteria, UA MANY (A) NONE SEEN   Squamous Epithelial / HPF 6-10 0 - 5 /HPF  CBC     Status: Abnormal   Collection Time: 12/19/23 10:36 AM  Result Value Ref Range   WBC 6.7 4.0 - 10.5 K/uL   RBC 3.21 (L) 3.87 - 5.11 MIL/uL   Hemoglobin 10.1 (L) 12.0 - 15.0 g/dL   HCT 47.8 (L) 29.5 - 62.1 %   MCV 94.7 80.0 - 100.0 fL   MCH 31.5 26.0 - 34.0 pg   MCHC 33.2 30.0 - 36.0 g/dL   RDW 30.8 65.7 - 84.6 %   Platelets 160 150 - 400 K/uL   nRBC 0.0 0.0 - 0.2 %  Comprehensive metabolic panel     Status: Abnormal   Collection Time: 12/19/23 10:36 AM  Result Value Ref Range   Sodium 136 135 - 145 mmol/L   Potassium 3.8 3.5 - 5.1 mmol/L   Chloride 110 98 - 111 mmol/L   CO2 22 22 - 32 mmol/L   Glucose, Bld 78 70 - 99 mg/dL   BUN <5 (L) 6 - 20 mg/dL   Creatinine, Ser 9.62 0.44 - 1.00 mg/dL   Calcium 8.5 (L) 8.9 - 10.3 mg/dL   Total Protein 6.2 (L) 6.5 - 8.1 g/dL   Albumin 2.7 (L) 3.5 - 5.0 g/dL   AST 19 15 - 41 U/L   ALT 19 0 - 44 U/L   Alkaline Phosphatase 59 38 - 126 U/L   Total Bilirubin 0.3 0.0 - 1.2 mg/dL   GFR, Estimated >95 >28 mL/min   Anion gap 4 (L) 5 - 15   Imaging:  No results found.  MDM & MAU COURSE  MDM: High  MAU  Course: Orders Placed This Encounter  Procedures   Culture, OB Urine   US RENAL   Urinalysis, Routine w reflex microscopic -Urine, Clean Catch   CBC   Comprehensive metabolic panel   Protein / creatinine ratio, urine   Urinalysis, Microscopic (reflex)   AMB Referral to Cardio Obstetrics   Discharge patient   Meds  ordered this encounter  Medications   NIFEdipine (PROCARDIA-XL/NIFEDICAL-XL) 24 hr tablet 30 mg   NIFEdipine (ADALAT CC) 30 MG 24 hr tablet    Sig: Take 1 tablet (30 mg total) by mouth daily.    Dispense:  30 tablet    Refill:  0   DISCONTD: cefadroxil (DURICEF) 500 MG capsule    Sig: Take 1 capsule (500 mg total) by mouth 2 (two) times daily.    Dispense:  14 capsule    Refill:  0   amoxicillin-clavulanate (AUGMENTIN) 875-125 MG tablet    Sig: Take 1 tablet by mouth 2 (two) times daily for 10 days.    Dispense:  20 tablet    Refill:  0   Pt was hypertensive on arrival but BP returned to normal as she remained reclined.  Differential diagnosis is most concerning for chronic hypertension with superimposed preeclampsia. Other potential diagnoses include nephropathy, migraine w/ aura, and primary aldosteronism, although less likely within this clinical context.  UA did show nitrites and trace blood, indicating a UTI. On chart review, pt has had a recurrent UTI for the entirety of this pregnancy. She has been treated with keflex and duricef. Consulted pharmacy who reviewed her C&S and suggested another round of high dose augmentin. Given her recurrence, ordered a renal ultrasound (outpatient as the pt was ready to go and asymptomatic for pyelo).   CNM consulted OB Cardio, Dr. Servando Salina, who recommended starting the pt on 30mg  daily of nifedipine. Referral to [facility] placed.   ASSESSMENT  1. Chronic hypertension during pregnancy (Primary) - AMB Referral to Cardio Obstetrics - Discharge patient  2. [redacted] weeks gestation of pregnancy  3. NST (non-stress test) reactive  4. Urinary tract infection in mother during second trimester of pregnancy - US RENAL; Future  PLAN  Discharge home in stable condition with return precautions.    Follow-up Information     [facility] Follow up.   Why: as scheduled for ongoing prenatal care Contact information: [address] [place] [address] [phone]        Thomasene Ripple, DO Follow up.   Specialty: Cardiology Contact information: [address] [place] [address] [phone]                 Allergies as of 12/19/2023   No Known Allergies      Medication List     TAKE these medications    amoxicillin-clavulanate 875-125 MG tablet Commonly known as: AUGMENTIN Take 1 tablet by mouth 2 (two) times daily for 10 days.   aspirin EC 81 MG tablet Take 2 tablets (162 mg total) by mouth daily. Swallow whole.   famotidine 20 MG tablet Commonly known as: PEPCID Take 1 tablet (20 mg total) by mouth 2 (two) times daily.   NIFEdipine 30 MG 24 hr tablet Commonly known as: ADALAT CC Take 1 tablet (30 mg total) by mouth daily.   Prenatal 28-0.8 MG Tabs Take 1 tablet by mouth daily.       Saddie Benders, Medical Student  [employer]  CNM ATTESTATION:  I have  seen and examined this patient and agree with above documentation in the medical student's note.   Anita Herrera is a 28 y.o. [phone] at [redacted]w[redacted]d reporting an episode of SOB, visual disturbances (black spots) and fullness in her head (but not a headache). Endorses +FM, denies LOF, VB, contractions, vaginal discharge, urgency/frequency, dysuria or back pain.  PE: Patient Vitals for the past 24 hrs:  BP Temp Pulse Resp Height Weight  12/19/23 1215 122/70 -- 89 -- -- --  12/19/23 1200 (!) 113/58 -- 88 -- -- --  12/19/23 1145 128/67 -- 87 -- -- --  12/19/23 1130 135/63 -- 85 -- -- --  12/19/23 1115 128/71 -- 87 -- -- --  12/19/23 1100 132/67 -- 85 -- -- --  12/19/23 1045 135/73 -- 87 -- -- --  12/19/23 1030 134/72 -- 81 -- -- --  12/19/23 1000 138/77 -- 94 -- -- --  12/19/23 0945 (!) 146/72 -- 98 -- -- --  12/19/23 0941 (!) 147/83 (!) 97.5 F (36.4 C) 100 18 -- --  12/19/23 0923 -- -- -- -- 5\' 7"  (1.702 m) 293 lb (132.9 kg)   Gen: alert, oriented female in no physical  distress Resp: normal effort, no problems with respiration noted Heart: Regular rate, warm and well perfused Abd: Soft, non-tender, gravid  FHR:  - Baseline 125 - Moderate variability, pos accels, no decels - Toco: none, UI occasionally  ROS, PMH reviewed  Orders Placed This Encounter  Procedures   Culture, OB Urine   US RENAL   Urinalysis, Routine w reflex microscopic -Urine, Clean Catch   CBC   Comprehensive metabolic panel   Protein / creatinine ratio, urine   Urinalysis, Microscopic (reflex)   AMB Referral to Cardio Obstetrics   Discharge patient   Meds ordered this encounter  Medications   NIFEdipine (PROCARDIA-XL/NIFEDICAL-XL) 24 hr tablet 30 mg   NIFEdipine (ADALAT CC) 30 MG 24 hr tablet    Sig: Take 1 tablet (30 mg total) by mouth daily.    Dispense:  30 tablet    Refill:  0   DISCONTD: cefadroxil (DURICEF) 500 MG capsule    Sig: Take 1 capsule (500 mg total) by mouth 2 (two) times daily.    Dispense:  14 capsule    Refill:  0   amoxicillin-clavulanate (AUGMENTIN) 875-125 MG tablet    Sig: Take 1 tablet by mouth 2 (two) times daily for 10 days.    Dispense:  20 tablet    Refill:  0   Results for orders placed or performed during the hospital encounter of 12/19/23 (from the past 24 hours)  Urinalysis, Routine w reflex microscopic -Urine, Clean Catch     Status: Abnormal   Collection Time: 12/19/23  9:50 AM  Result Value Ref Range   Color, Urine YELLOW YELLOW   APPearance CLOUDY (A) CLEAR   Specific Gravity, Urine 1.025 1.005 - 1.030   pH 6.0 5.0 - 8.0   Glucose, UA NEGATIVE NEGATIVE mg/dL   Hgb urine dipstick SMALL (A) NEGATIVE   Bilirubin Urine NEGATIVE NEGATIVE   Ketones, ur NEGATIVE NEGATIVE mg/dL   Protein, ur NEGATIVE NEGATIVE mg/dL   Nitrite POSITIVE (A) NEGATIVE   Leukocytes,Ua TRACE (A) NEGATIVE  Urinalysis, Microscopic (reflex)     Status: Abnormal   Collection Time: 12/19/23  9:50 AM  Result Value Ref Range   RBC / HPF 0-5 0 - 5 RBC/hpf    WBC, UA 6-10 0 - 5 WBC/hpf   Bacteria, UA MANY (  A) NONE SEEN   Squamous Epithelial / HPF 6-10 0 - 5 /HPF  CBC     Status: Abnormal   Collection Time: 12/19/23 10:36 AM  Result Value Ref Range   WBC 6.7 4.0 - 10.5 K/uL   RBC 3.21 (L) 3.87 - 5.11 MIL/uL   Hemoglobin 10.1 (L) 12.0 - 15.0 g/dL   HCT 16.1 (L) 09.6 - 04.5 %   MCV 94.7 80.0 - 100.0 fL   MCH 31.5 26.0 - 34.0 pg   MCHC 33.2 30.0 - 36.0 g/dL   RDW 40.9 81.1 - 91.4 %   Platelets 160 150 - 400 K/uL   nRBC 0.0 0.0 - 0.2 %  Comprehensive metabolic panel     Status: Abnormal   Collection Time: 12/19/23 10:36 AM  Result Value Ref Range   Sodium 136 135 - 145 mmol/L   Potassium 3.8 3.5 - 5.1 mmol/L   Chloride 110 98 - 111 mmol/L   CO2 22 22 - 32 mmol/L   Glucose, Bld 78 70 - 99 mg/dL   BUN <5 (L) 6 - 20 mg/dL   Creatinine, Ser 7.82 0.44 - 1.00 mg/dL   Calcium 8.5 (L) 8.9 - 10.3 mg/dL   Total Protein 6.2 (L) 6.5 - 8.1 g/dL   Albumin 2.7 (L) 3.5 - 5.0 g/dL   AST 19 15 - 41 U/L   ALT 19 0 - 44 U/L   Alkaline Phosphatase 59 38 - 126 U/L   Total Bilirubin 0.3 0.0 - 1.2 mg/dL   GFR, Estimated >95 >62 mL/min   Anion gap 4 (L) 5 - 15   No results found.  MDM High  MAU COURSE: See student note.  ASSESSMENT & PLAN: 1. Chronic hypertension during pregnancy (Primary) - AMB Referral to Cardio Obstetrics - Discharge patient  2. [redacted] weeks gestation of pregnancy  3. NST (non-stress test) reactive  4. Urinary tract infection in mother during second trimester of pregnancy - US RENAL; Future   - Discharge home in stable condition - Pyelo and third trimester precautions given - Follow-up as scheduled at [facility] and with OB Cardio.  - Return to MAU if symptoms worsen or fail to improve  Bernerd Limbo, CNM 12/19/2023 4:45 PM

## 2023-12-19 NOTE — Telephone Encounter (Signed)
 Follow up on alert from [employer]. Pt reporting BP 167/98. Pt confirmed BP and also reports SOB and vision changes. Pt advised to go directly to MAU. Reviewed directions and location for MAU. Pt verbalized understanding and intent to go to MAU.

## 2023-12-21 LAB — URINE CULTURE, OB REFLEX

## 2023-12-21 LAB — CULTURE, OB URINE: Culture: >=100000 — AB

## 2023-12-22 ENCOUNTER — Other Ambulatory Visit: Payer: Self-pay | Admitting: Obstetrics and Gynecology

## 2023-12-30 ENCOUNTER — Ambulatory Visit: Payer: Medicaid Other | Admitting: *Deleted

## 2023-12-30 ENCOUNTER — Encounter: Payer: Self-pay | Admitting: *Deleted

## 2023-12-30 ENCOUNTER — Other Ambulatory Visit: Payer: Self-pay | Admitting: *Deleted

## 2023-12-30 ENCOUNTER — Ambulatory Visit: Payer: Medicaid Other | Attending: Obstetrics

## 2023-12-30 ENCOUNTER — Ambulatory Visit: Attending: Obstetrics and Gynecology | Admitting: Obstetrics and Gynecology

## 2023-12-30 VITALS — BP 149/72 | HR 99

## 2023-12-30 DIAGNOSIS — O35DXX0 Maternal care for other (suspected) fetal abnormality and damage, fetal gastrointestinal anomalies, not applicable or unspecified: Secondary | ICD-10-CM | POA: Diagnosis not present

## 2023-12-30 DIAGNOSIS — O403XX0 Polyhydramnios, third trimester, not applicable or unspecified: Secondary | ICD-10-CM

## 2023-12-30 DIAGNOSIS — E669 Obesity, unspecified: Secondary | ICD-10-CM

## 2023-12-30 DIAGNOSIS — O0933 Supervision of pregnancy with insufficient antenatal care, third trimester: Secondary | ICD-10-CM

## 2023-12-30 DIAGNOSIS — O99213 Obesity complicating pregnancy, third trimester: Secondary | ICD-10-CM

## 2023-12-30 DIAGNOSIS — O0930 Supervision of pregnancy with insufficient antenatal care, unspecified trimester: Secondary | ICD-10-CM | POA: Diagnosis present

## 2023-12-30 DIAGNOSIS — O10013 Pre-existing essential hypertension complicating pregnancy, third trimester: Secondary | ICD-10-CM

## 2023-12-30 DIAGNOSIS — O0990 Supervision of high risk pregnancy, unspecified, unspecified trimester: Secondary | ICD-10-CM

## 2023-12-30 DIAGNOSIS — Z3A29 29 weeks gestation of pregnancy: Secondary | ICD-10-CM

## 2023-12-30 DIAGNOSIS — O99210 Obesity complicating pregnancy, unspecified trimester: Secondary | ICD-10-CM | POA: Diagnosis present

## 2023-12-30 DIAGNOSIS — O10919 Unspecified pre-existing hypertension complicating pregnancy, unspecified trimester: Secondary | ICD-10-CM

## 2023-12-30 DIAGNOSIS — E6689 Other obesity not elsewhere classified: Secondary | ICD-10-CM

## 2023-12-30 NOTE — Progress Notes (Signed)
 Maternal-Fetal Medicine Consultation I had the pleasure of seeing Anita Herrera [facility] at [facility]. She is G8 P3043 at 19w 3d gestation and is here for fetal growth assessment and completion of fetal anatomy.  Patient had opted not to have screening for fetal aneuploidies.  She was evaluated at the MAU about 10 days ago for increased blood pressure readings at home monitoring.  A diagnosis of chronic hypertension was made, and she was prescribed nifedipine.  Patient stopped taking nifedipine because of headache.  Blood pressure today at our office is 149/72 mmHg.  She does not have signs and symptoms of severe features of preeclampsia. Obstetric history significant for 2 term vaginal deliveries.  Her second pregnancy was complicated by preeclampsia. Pregravid BMI 42. Patient does not have gestational diabetes.  Ultrasound Fetal growth is appropriate for gestational age.  Mild polyhydramnios is seen (AFI 25 cm).  Cardiac anatomy could still not be evaluated because of fetal position. Echogenic bowel is seen.  No obvious fetal structural defects are seen. As maternal obesity imposes limitations on the resolution of images, fetal anomalies may be missed.  Our concerns include Echogenic bowel  -I discussed the finding of echogenic bowel seen on today's ultrasound. Echogenic bowel is present in 2 to 7 per 1,000 obstetric population. It can be associated with   a) Fetal aneuploidies (most commonly, Down syndrome) are seen in 3% to 5% of fetuses with echogenic bowel. Patient had opted not to screen for fetal aneuploidies. I discussed cell-free fetal DNA screening, its significance, and limitations. I recommended cell-free fetal DNA screening. Patient is considering cell-free fetal DNA screening. B) Fetal growth restriction (10% to 20% with isolated echogenic bowel).  I reassured patient of normal fetal growth assessment.   C) Fetal infection can be associated with this  finding (0.5% to 6.3%). I counseled her on screening for CMV and toxoplasmosis. Patient does not have a history of fever or rashes. CMV infection is unlikely.  d) Cystic fibrosis (CF). Incidence of cystic fibrosis with isolated echogenic bowel ranges from 1.3% to 5%. I could not locate carrier screening results and the patient is not aware of screening for CF. Recommend cystic fibrosis screening if not already performed. e) Intraamniotic bleeding. Patient does not give history of vaginal bleeding. f) Normal fetus.  Patient has a prenatal visit appointment and will have her blood work during her visit.  Chronic hypertension versus gestational hypertension I counseled the patient that treatment of mild hypertension improved the pregnancy outcome and decreases the likelihood of preeclampsia with severe features.  I recommended that she consider labetalol, which is not associated with fetal adverse outcomes. If the diagnosis of chronic hypertension versus gestational hypertension is uncertain, I recommend that she be managed as a patient with gestational hypertension. I discussed timing of delivery.  In pregnancy is complicated with gestational hypertension, we recommend delivery at [redacted] weeks gestation.  Hypertension is associated with maternal complications including stroke, eclampsia and endorgan damage.  Placental abruption is more common.  Mild polyhydramnios I reassured the patient that mild polyhydramnios in the absence of gestational diabetes is usually idiopathic (no known cause) and is associated with good pregnancy outcomes.  Rarely, polyhydramnios can be associated with fetal anomalies that may be detected only after birth.  Recommendations -An appointment was made for her to return in 3 weeks for BPP. -Weekly BPP from [redacted] weeks gestation till delivery. -Consider delivery at [redacted] weeks gestation if diagnosis of gestational hypertension is confirmed or  is uncertain. -Consider labetalol. -CMV,  toxoplasmosis IgG and IgM. -Cell-free fetal DNA screening (panorama) -Cystic fibrosis screening -Blood to be drawn at your office tomorrow (will send a message to Dr. Donavan Foil).  Consultation including face-to-face (more than 50%) counseling 30 minutes.

## 2023-12-31 ENCOUNTER — Ambulatory Visit (INDEPENDENT_AMBULATORY_CARE_PROVIDER_SITE_OTHER): Admitting: Obstetrics and Gynecology

## 2023-12-31 VITALS — BP 135/84 | HR 96 | Wt 285.0 lb

## 2023-12-31 DIAGNOSIS — O0990 Supervision of high risk pregnancy, unspecified, unspecified trimester: Secondary | ICD-10-CM | POA: Diagnosis not present

## 2023-12-31 DIAGNOSIS — O99210 Obesity complicating pregnancy, unspecified trimester: Secondary | ICD-10-CM

## 2023-12-31 DIAGNOSIS — O283 Abnormal ultrasonic finding on antenatal screening of mother: Secondary | ICD-10-CM | POA: Insufficient documentation

## 2023-12-31 DIAGNOSIS — O10919 Unspecified pre-existing hypertension complicating pregnancy, unspecified trimester: Secondary | ICD-10-CM | POA: Diagnosis not present

## 2023-12-31 DIAGNOSIS — Z3A29 29 weeks gestation of pregnancy: Secondary | ICD-10-CM | POA: Diagnosis not present

## 2023-12-31 DIAGNOSIS — Z8759 Personal history of other complications of pregnancy, childbirth and the puerperium: Secondary | ICD-10-CM

## 2023-12-31 MED ORDER — LABETALOL HCL 100 MG PO TABS: 100.0000 mg | ORAL_TABLET | Freq: Two times a day (BID) | ORAL | 3 refills | Status: DC

## 2023-12-31 NOTE — Progress Notes (Signed)
   PRENATAL VISIT NOTE  Subjective:  Anita Herrera is a 28 y.o. [phone] at [redacted]w[redacted]d being seen today for ongoing prenatal care.  She is currently monitored for the following issues for this high-risk pregnancy and has LGSIL of cervix of undetermined significance; History of shoulder dystocia in prior pregnancy-LGA; Supervision of high risk pregnancy, antepartum; Late prenatal care affecting pregnancy; Obesity affecting pregnancy, antepartum; History of gestational hypertension; Chronic hypertension affecting pregnancy; and Abnormal fetal ultrasound, echogenic bowel on their problem list.  Patient doing well with no acute concerns today. She reports no complaints.  Contractions: Not present. Vag. Bleeding: None.  Movement: Present. Denies leaking of fluid.   The following portions of the patient's history were reviewed and updated as appropriate: allergies, current medications, past family history, past medical history, past social history, past surgical history and problem list. Problem list updated.  Objective:   Vitals:   12/31/23 1100  BP: 135/84  Pulse: 96  Weight: 285 lb (129.3 kg)    Fetal Status: Fetal Heart Rate (bpm): 130 Fundal Height: 29 cm Movement: Present     General:  Alert, oriented and cooperative. Patient is in no acute distress.  Skin: Skin is warm and dry. No rash noted.   Cardiovascular: Normal heart rate noted  Respiratory: Normal respiratory effort, no problems with respiration noted  Abdomen: Soft, gravid, appropriate for gestational age.  Pain/Pressure: Absent     Pelvic: Cervical exam deferred        Extremities: Normal range of motion.     Mental Status:  Normal mood and affect. Normal behavior. Normal judgment and thought content.   Assessment and Plan:  Pregnancy: [name] at [redacted]w[redacted]d  1. [redacted] weeks gestation of pregnancy (Primary)  - HORIZON Basic Panel  2. Chronic hypertension affecting pregnancy Pt did not tolerate procardia well Rx sent for  labetalol - labetalol (NORMODYNE) 100 MG tablet; Take 1 tablet (100 mg total) by mouth 2 (two) times daily.  Dispense: 60 tablet; Refill: 3  3. Supervision of high risk pregnancy, antepartum Continue routine prenatal care - HORIZON Basic Panel  4. Obesity affecting pregnancy, antepartum, unspecified obesity type   5. History of shoulder dystocia in prior pregnancy-LGA Monitor fetal growth, last scan EFW at 60%  6. Abnormal fetal ultrasound, echogenic bowel Echogenic bowel seen at last u/s  Per Dr. Judeth Cornfield, he recommended infectious disease labs as well as genetic testing as patient did not get it done earlier in the pregnancy. - CMV abs, IgG+IgM (cytomegalovirus) - Toxoplasma antibodies- IgG and  IgM - PANORAMA PRENATAL TEST  Preterm labor symptoms and general obstetric precautions including but not limited to vaginal bleeding, contractions, leaking of fluid and fetal movement were reviewed in detail with the patient.  Please refer to After Visit Summary for other counseling recommendations.   Return in about 2 weeks (around 01/14/2024) for [facility], in person.   Anita Aloe, MD Faculty Attending [facility]

## 2023-12-31 NOTE — Addendum Note (Signed)
 Addended by: Maretta Bees on: 12/31/2023 01:43 PM   Modules accepted: Orders

## 2023-12-31 NOTE — Progress Notes (Signed)
 Please review u/s with pt. Pt was recently started on BP meds - could not tolerate after a couple of day- was having HA's and nausea. Please discuss need for Cardio referral. Pt is currently taking abx for UTI.

## 2024-01-01 LAB — TOXOPLASMA ANTIBODIES- IGG AND  IGM
Toxoplasma Antibody- IgM: <3 [AU]/ml (ref 0.0–7.9)
Toxoplasma IgG Ratio: <3 [IU]/mL (ref 0.0–7.1)

## 2024-01-01 LAB — CMV ABS, IGG+IGM (CYTOMEGALOVIRUS)
CMV Ab - IgG: 7.8 U/mL — ABNORMAL HIGH (ref 0.00–0.59)
CMV IgM Ser EIA-aCnc: <30 [AU]/ml (ref 0.0–29.9)

## 2024-01-05 ENCOUNTER — Encounter: Payer: Self-pay | Admitting: Obstetrics and Gynecology

## 2024-01-05 ENCOUNTER — Telehealth: Payer: Self-pay | Admitting: *Deleted

## 2024-01-05 ENCOUNTER — Other Ambulatory Visit: Payer: Self-pay | Admitting: *Deleted

## 2024-01-05 DIAGNOSIS — O0990 Supervision of high risk pregnancy, unspecified, unspecified trimester: Secondary | ICD-10-CM

## 2024-01-05 NOTE — Telephone Encounter (Signed)
 TC to pt to follow up on [employer] alert for BP 149/85. Pt reports she did take her labetalol this AM. Reports HA and vision changes. Just took Tylenol recently. Reports vision changes with moving around to perform tasks at work. Reports needing to sit down to relieve vision changes. Reports feeling SOB with moving around. Advised pt to seek care in MAU. Pt verbalized understanding.

## 2024-01-06 LAB — PANORAMA PRENATAL TEST FULL PANEL:PANORAMA TEST PLUS 5 ADDITIONAL MICRODELETIONS: FETAL FRACTION: 11

## 2024-01-07 LAB — HORIZON CUSTOM: REPORT SUMMARY: NEGATIVE

## 2024-01-08 ENCOUNTER — Ambulatory Visit: Admitting: Certified Nurse Midwife

## 2024-01-08 ENCOUNTER — Telehealth: Payer: Self-pay

## 2024-01-08 VITALS — BP 128/84 | HR 97 | Wt 282.8 lb

## 2024-01-08 DIAGNOSIS — Z3A30 30 weeks gestation of pregnancy: Secondary | ICD-10-CM | POA: Diagnosis not present

## 2024-01-08 DIAGNOSIS — O10919 Unspecified pre-existing hypertension complicating pregnancy, unspecified trimester: Secondary | ICD-10-CM

## 2024-01-08 DIAGNOSIS — O0990 Supervision of high risk pregnancy, unspecified, unspecified trimester: Secondary | ICD-10-CM

## 2024-01-08 NOTE — Telephone Encounter (Signed)
 TC to pt about vision changes, elevated BP, and SOB. Dr. Para March recommends pt come in today for appt to be evaluated. Spoke with pt on phone, SOB and vision seems to be better since being sent home from work. Work [employer] not effective, pt sits with a fan but is still hot at work, not helping her symptoms. Pt reports at her visit today she would like to discuss with provider about being out of work due to all of these symptoms only happening at work. Pt has had multiple reports of increased BP and has been seen in MAU previously for same symptoms.

## 2024-01-08 NOTE — Progress Notes (Signed)
   PRENATAL VISIT NOTE  Subjective:  Anita Herrera is a 28 y.o. [phone] at [redacted]w[redacted]d being seen today for ongoing prenatal care.  She is currently monitored for the following issues for this high-risk pregnancy and has LGSIL of cervix of undetermined significance; History of shoulder dystocia in prior pregnancy-LGA; Supervision of high risk pregnancy, antepartum; Late prenatal care affecting pregnancy; Obesity affecting pregnancy, antepartum; History of gestational hypertension; Chronic hypertension affecting pregnancy; and Abnormal fetal ultrasound, echogenic bowel on their problem list.  Patient reports elevated BP, headaches, SOB, and dizziness while at work this morning. This has been an ongoing issue per the patient. She states that her job does not have air conditioning and she gets hot. When she checks her BP during theses episodes it is elevated. She reports eating balanced diet and staying hydrated. She denies any of these symptoms now or at any other time while not at work. Bps otherwise controlled with labetalol.  Contractions: Not present. Vag. Bleeding: None.  Movement: Present. Denies leaking of fluid.   The following portions of the patient's history were reviewed and updated as appropriate: allergies, current medications, past family history, past medical history, past social history, past surgical history and problem list.   Objective:   Vitals:   01/08/24 1322 01/08/24 1329  BP: (!) 139/90 128/84  Pulse: 91 97  Weight: 128.3 kg     Fetal Status: Fetal Heart Rate (bpm): 132 Fundal Height: 30 cm Movement: Present     General:  Alert, oriented and cooperative. Patient is in no acute distress.  Skin: Skin is warm and dry. No rash noted.   Cardiovascular: Normal heart rate noted  Respiratory: Normal respiratory effort, no problems with respiration noted  Abdomen: Soft, gravid, appropriate for gestational age.  Pain/Pressure: Absent     Pelvic: Cervical exam deferred         Extremities: Normal range of motion.  Edema: None  Mental Status: Normal mood and affect. Normal behavior. Normal judgment and thought content.   Assessment and Plan:  Pregnancy: [name] at [redacted]w[redacted]d 1. Supervision of high risk pregnancy, antepartum (Primary) - Doing well overall but unable to work due to work conditions and stress induced elevated BP, H/A, dizziness, and SOB. - Work note given  2. [redacted] weeks gestation of pregnancy - Routine OB care  3. Chronic hypertension affecting pregnancy - checking BP at home - controlled with Labetalol 100mg  BID - Report to MAU if any symptoms of elevated BP persist  Preterm labor symptoms and general obstetric precautions including but not limited to vaginal bleeding, contractions, leaking of fluid and fetal movement were reviewed in detail with the patient. Please refer to After Visit Summary for other counseling recommendations.   Return in about 2 weeks (around 01/22/2024) for HROB.  Future Appointments  Date Time Provider Department Center  01/14/2024  1:30 PM Ralene Muskrat, PA-C [facility] None  01/21/2024  3:15 PM [facility] US2 [facility]  01/28/2024  1:00 PM [facility] PROVIDER 1 [facility]  01/28/2024  1:30 PM [facility] US4 [facility]  02/04/2024  1:00 PM [facility] PROVIDER 1 [facility]  02/04/2024  1:30 PM [facility] US4 [facility]  02/11/2024  1:00 PM [facility] PROVIDER 1 [facility]  02/11/2024  1:30 PM [facility] US2 [facility]  02/13/2024  2:20 PM Tobb, Kardie, DO [facility] None    Elige Ko, Student-MidWife

## 2024-01-08 NOTE — Progress Notes (Unsigned)
 Pt presents for rob. Pt is concerned about her bp being high. Today's reading 139/90 and 128/84

## 2024-01-12 NOTE — Progress Notes (Unsigned)
   PRENATAL VISIT NOTE  Subjective:  Anita Herrera is a 28 y.o. [phone] at [redacted]w[redacted]d being seen today for ongoing prenatal care.  She is currently monitored for the following issues for this {Blank single:19197::"high-risk","low-risk"} pregnancy and has LGSIL of cervix of undetermined significance; History of shoulder dystocia in prior pregnancy-LGA; Supervision of high risk pregnancy, antepartum; Late prenatal care affecting pregnancy; Obesity affecting pregnancy, antepartum; History of gestational hypertension; Chronic hypertension affecting pregnancy; and Abnormal fetal ultrasound, echogenic bowel on their problem list.  Patient reports {sx:14538}.   .  .   . Denies leaking of fluid.   The following portions of the patient's history were reviewed and updated as appropriate: allergies, current medications, past family history, past medical history, past social history, past surgical history and problem list.   Objective:  There were no vitals filed for this visit.  Fetal Status:           General:  Alert, oriented and cooperative. Patient is in no acute distress.  Skin: Skin is warm and dry. No rash noted.   Cardiovascular: Normal heart rate noted  Respiratory: Normal respiratory effort, no problems with respiration noted  Abdomen: Soft, gravid, appropriate for gestational age.        Pelvic: Cervical exam deferred        Extremities: Normal range of motion.     Mental Status: Normal mood and affect. Normal behavior. Normal judgment and thought content.   Assessment and Plan:  Pregnancy: [id number] at [redacted]w[redacted]d  1. Supervision of high risk pregnancy, antepartum (Primary) Patient is doing well, feeling regular fetal movement  BP, FHR, FH appropriate  2. [redacted] weeks gestation of pregnancy Anticipatory guidance about next visits/weeks of pregnancy given.   3. Chronic hypertension affecting pregnancy Continue ASA Normotensive, no current meds Normal baseline labs Growth [place]  scheduled*** Delivery at 39 weeks Ssxs preE reviewed   4. BMI 40.0-44.9, adult (HCC) Growth scan every 4w at 24w Weekly antenatal testing at 34w***      Preterm labor symptoms and general obstetric precautions including but not limited to vaginal bleeding, contractions, leaking of fluid and fetal movement were reviewed in detail with the patient. Please refer to After Visit Summary for other counseling recommendations.   No follow-ups on file.  Future Appointments  Date Time Provider Department Center  01/14/2024  1:30 PM Ralene Muskrat, PA-C [facility] None  01/21/2024  3:15 PM [facility] US2 [facility]  01/28/2024  1:00 PM [facility] PROVIDER 1 [facility]  01/28/2024  1:30 PM [facility] US4 [facility]  02/04/2024  1:00 PM [facility] PROVIDER 1 [facility]  02/04/2024  1:30 PM [facility] US4 [facility]  02/11/2024  1:00 PM [facility] PROVIDER 1 [facility]  02/11/2024  1:30 PM [facility] US2 [facility]  02/13/2024  2:20 PM Tobb, Kardie, DO [facility] None    Ralene Muskrat, [place]

## 2024-01-13 ENCOUNTER — Other Ambulatory Visit: Payer: Self-pay | Admitting: Certified Nurse Midwife

## 2024-01-14 ENCOUNTER — Ambulatory Visit (INDEPENDENT_AMBULATORY_CARE_PROVIDER_SITE_OTHER): Admitting: Physician Assistant

## 2024-01-14 ENCOUNTER — Other Ambulatory Visit: Payer: Self-pay | Admitting: Obstetrics and Gynecology

## 2024-01-14 VITALS — BP 135/79 | HR 93 | Wt 285.0 lb

## 2024-01-14 DIAGNOSIS — O0990 Supervision of high risk pregnancy, unspecified, unspecified trimester: Secondary | ICD-10-CM

## 2024-01-14 DIAGNOSIS — Z6841 Body Mass Index (BMI) 40.0 and over, adult: Secondary | ICD-10-CM | POA: Diagnosis not present

## 2024-01-14 DIAGNOSIS — Z3A31 31 weeks gestation of pregnancy: Secondary | ICD-10-CM

## 2024-01-14 DIAGNOSIS — R8271 Bacteriuria: Secondary | ICD-10-CM

## 2024-01-14 DIAGNOSIS — O10919 Unspecified pre-existing hypertension complicating pregnancy, unspecified trimester: Secondary | ICD-10-CM | POA: Diagnosis not present

## 2024-01-14 DIAGNOSIS — O09293 Supervision of pregnancy with other poor reproductive or obstetric history, third trimester: Secondary | ICD-10-CM

## 2024-01-14 DIAGNOSIS — O99891 Other specified diseases and conditions complicating pregnancy: Secondary | ICD-10-CM

## 2024-01-14 NOTE — Progress Notes (Signed)
 Pt presents for ROB visit. No concerns

## 2024-01-16 LAB — PROTEIN / CREATININE RATIO, URINE
Creatinine, Urine: 58.6 mg/dL
Protein, Ur: 31.8 mg/dL
Protein/Creat Ratio: 543 mg/g{creat} — ABNORMAL HIGH (ref 0–200)

## 2024-01-17 ENCOUNTER — Inpatient Hospital Stay (HOSPITAL_COMMUNITY)
Admission: AD | Admit: 2024-01-17 | Discharge: 2024-01-17 | Disposition: A | Discharge status: Home / Self Care | Attending: Family Medicine | Admitting: Family Medicine

## 2024-01-17 ENCOUNTER — Encounter (HOSPITAL_COMMUNITY): Payer: Self-pay | Admitting: Family Medicine

## 2024-01-17 ENCOUNTER — Encounter: Payer: Self-pay | Admitting: Physician Assistant

## 2024-01-17 DIAGNOSIS — R0609 Other forms of dyspnea: Secondary | ICD-10-CM | POA: Diagnosis not present

## 2024-01-17 DIAGNOSIS — O10013 Pre-existing essential hypertension complicating pregnancy, third trimester: Secondary | ICD-10-CM | POA: Insufficient documentation

## 2024-01-17 DIAGNOSIS — N3001 Acute cystitis with hematuria: Secondary | ICD-10-CM | POA: Insufficient documentation

## 2024-01-17 DIAGNOSIS — O10913 Unspecified pre-existing hypertension complicating pregnancy, third trimester: Secondary | ICD-10-CM

## 2024-01-17 DIAGNOSIS — B961 Klebsiella pneumoniae [K. pneumoniae] as the cause of diseases classified elsewhere: Secondary | ICD-10-CM | POA: Diagnosis not present

## 2024-01-17 DIAGNOSIS — Z3A32 32 weeks gestation of pregnancy: Secondary | ICD-10-CM | POA: Diagnosis not present

## 2024-01-17 DIAGNOSIS — O2313 Infections of bladder in pregnancy, third trimester: Secondary | ICD-10-CM | POA: Diagnosis not present

## 2024-01-17 DIAGNOSIS — K219 Gastro-esophageal reflux disease without esophagitis: Secondary | ICD-10-CM | POA: Diagnosis present

## 2024-01-17 DIAGNOSIS — R0602 Shortness of breath: Secondary | ICD-10-CM | POA: Diagnosis present

## 2024-01-17 DIAGNOSIS — O26893 Other specified pregnancy related conditions, third trimester: Secondary | ICD-10-CM | POA: Diagnosis not present

## 2024-01-17 DIAGNOSIS — R519 Headache, unspecified: Secondary | ICD-10-CM | POA: Diagnosis present

## 2024-01-17 DIAGNOSIS — O10919 Unspecified pre-existing hypertension complicating pregnancy, unspecified trimester: Secondary | ICD-10-CM

## 2024-01-17 LAB — URINALYSIS, ROUTINE W REFLEX MICROSCOPIC
Bilirubin Urine: NEGATIVE
Glucose, UA: NEGATIVE mg/dL
Ketones, ur: NEGATIVE mg/dL
Nitrite: POSITIVE — AB
Protein, ur: 100 mg/dL — AB
RBC / HPF: >50 RBC/hpf (ref 0–5)
Specific Gravity, Urine: 1.025 (ref 1.005–1.030)
pH: 6 (ref 5.0–8.0)

## 2024-01-17 LAB — COMPREHENSIVE METABOLIC PANEL WITH GFR
ALT: 46 U/L — ABNORMAL HIGH (ref 0–44)
AST: 33 U/L (ref 15–41)
Albumin: 2.6 g/dL — ABNORMAL LOW (ref 3.5–5.0)
Alkaline Phosphatase: 87 U/L (ref 38–126)
Anion gap: 8 (ref 5–15)
BUN: 9 mg/dL (ref 6–20)
CO2: 19 mmol/L — ABNORMAL LOW (ref 22–32)
Calcium: 9.5 mg/dL (ref 8.9–10.3)
Chloride: 108 mmol/L (ref 98–111)
Creatinine, Ser: 0.46 mg/dL (ref 0.44–1.00)
GFR, Estimated: >60 mL/min (ref >60)
Glucose, Bld: 98 mg/dL (ref 70–99)
Potassium: 4.1 mmol/L (ref 3.5–5.1)
Sodium: 135 mmol/L (ref 135–145)
Total Bilirubin: 0.3 mg/dL (ref 0.0–1.2)
Total Protein: 6.6 g/dL (ref 6.5–8.1)

## 2024-01-17 LAB — CBC
HCT: 30.9 % — ABNORMAL LOW (ref 36.0–46.0)
Hemoglobin: 10.3 g/dL — ABNORMAL LOW (ref 12.0–15.0)
MCH: 30.6 pg (ref 26.0–34.0)
MCHC: 33.3 g/dL (ref 30.0–36.0)
MCV: 91.7 fL (ref 80.0–100.0)
Platelets: 220 10*3/uL (ref 150–400)
RBC: 3.37 MIL/uL — ABNORMAL LOW (ref 3.87–5.11)
RDW: 12.8 % (ref 11.5–15.5)
WBC: 9.5 10*3/uL (ref 4.0–10.5)
nRBC: 0 % (ref 0.0–0.2)

## 2024-01-17 LAB — PROTEIN / CREATININE RATIO, URINE
Creatinine, Urine: 165 mg/dL
Protein Creatinine Ratio: 0.18 mg/mg{creat} — ABNORMAL HIGH (ref 0.00–0.15)
Total Protein, Urine: 30 mg/dL

## 2024-01-17 LAB — BRAIN NATRIURETIC PEPTIDE: B Natriuretic Peptide: 15 pg/mL (ref 0.0–100.0)

## 2024-01-17 MED ORDER — LABETALOL HCL 100 MG PO TABS: 200.0000 mg | ORAL_TABLET | Freq: Two times a day (BID) | ORAL | 3 refills | Status: DC

## 2024-01-17 MED ORDER — PANTOPRAZOLE SODIUM 20 MG PO TBEC: 20.0000 mg | DELAYED_RELEASE_TABLET | Freq: Every day | ORAL | 1 refills | Status: AC

## 2024-01-17 MED ORDER — PANTOPRAZOLE SODIUM 20 MG PO TBEC
20.0000 mg | DELAYED_RELEASE_TABLET | Freq: Every day | ORAL | Status: DC
  Administered 2024-01-17: 20 mg via ORAL
  Filled 2024-01-17: qty 1

## 2024-01-17 MED ORDER — NITROFURANTOIN MONOHYD MACRO 100 MG PO CAPS: 100.0000 mg | ORAL_CAPSULE | Freq: Two times a day (BID) | ORAL | 0 refills | Status: DC

## 2024-01-17 MED ORDER — NITROFURANTOIN MONOHYD MACRO 100 MG PO CAPS
100.0000 mg | ORAL_CAPSULE | Freq: Two times a day (BID) | ORAL | Status: DC
  Administered 2024-01-17: 100 mg via ORAL
  Filled 2024-01-17: qty 1

## 2024-01-17 NOTE — MAU Note (Signed)
.  Anita Herrera is a 28 y.o. at [redacted]w[redacted]d here in MAU reporting: pt received a call from MD today that she had protein in her urine. And was told to watch for signs of pre eclampsia. Pt reports today that she was out shopping and had SOB, headache and spots in vision.  Pt states symptoms resolved spontaneously with rest but she does have mild SOB with exertion. Denies cold or flu symptoms  No acute distress, tachypnea, headache or spots currently. Endorses + fetal movement Denies vaginal bleeding, bloody show or vaginal discharge  :  Onset of complaint: 1400 Pain score: Denies Vitals:   01/17/24 2018  BP: 133/84  Pulse: (!) 101  Resp: 17  Temp: 98.5 F (36.9 C)  SpO2: 100%     FHT: 132bpm

## 2024-01-17 NOTE — MAU Provider Note (Signed)
 HA, SOB, spots in vision    S [name] is a 28 y.o. [phone] pregnant female at [redacted]w[redacted]d who presents to MAU today with complaint of signs of PreE in s/o being told earlier today she had proteinuria (543 mg/g creat on 4/9). Pt states that she had proteinuria and to watch for signs of symptoms of preE.  When she was shopping she states she had SOB, HA and spots in vision.   Currently not symptomatic.  Last had spots when walking into triage. Took tylenol at home prior to coming and states HA resolved. States did have an episode of RUQ last week that lasted for 4 days but none now.  States has intermittent epigastric discomfort from GERD. Denies VB / DFM / LOF / RUQ pain / SOB / CP / N/V / worsening swelling / HA currently.    Receives care at [facility]. Prenatal records reviewed. Hx of CHTN (labetalol 100mg  BID)   Pertinent items noted in HPI and remainder of comprehensive ROS otherwise negative.   O BP 126/67   Pulse 92   Temp 98.5 F (36.9 C) (Oral)   Resp 17   Ht 5\' 7"  (1.702 m)   Wt 130.5 kg   LMP 06/06/2023   SpO2 100%   BMI 45.04 kg/m  Physical Exam Vitals and nursing note reviewed.  Constitutional:      General: She is not in acute distress.    Appearance: Normal appearance. She is obese. She is not ill-appearing.  HENT:     Head: Normocephalic and atraumatic.     Right Ear: External ear normal.     Left Ear: External ear normal.     Nose: Nose normal. No congestion.     Mouth/Throat:     Mouth: Mucous membranes are moist.     Pharynx: Oropharynx is clear.  Eyes:     Extraocular Movements: Extraocular movements intact.     Conjunctiva/sclera: Conjunctivae normal.  Cardiovascular:     Rate and Rhythm: Normal rate.  Pulmonary:     Effort: Pulmonary effort is normal. No respiratory distress.  Abdominal:     General: There is no distension.     Tenderness: There is no abdominal tenderness.     Comments: gravid  Musculoskeletal:        General: No swelling.  Normal range of motion.     Cervical back: Normal range of motion.  Skin:    General: Skin is warm and dry.  Neurological:     Mental Status: She is alert and oriented to person, place, and time. Mental status is at baseline.     Motor: No weakness.  Psychiatric:        Mood and Affect: Mood normal.        Behavior: Behavior normal.   NST: 120bpm, moderate variability, +accels, no decels, no ctx   EKG: NSR with LVH criteria based on >11 on aVL  MDM: MAU Course: UA: large Hgb, protein 100, Nitrite +, LE trace, many bacteria  UPC 0.18 // 220 // 33/46*, Cr 0.46, K 4.1 BNP 15 Reactive NST   Mild range blood pressures here, remains asymptomatic.  PreE labs reassuring though will need to follow up OP to trend LFTs given slight elevation in ALT.  Stable for d/c.    AP #[redacted] weeks gestation #DOE - referral to Dr. Emmette Harms for cardiology follow up  #Acute Cystitis w/ hematuria - Macrobid sent - OB Ucx follow up  #cHTN - increased labetalol to  200mg  BID   Discharge from MAU in stable condition with strict / usual precautions Follow up at [facility] as scheduled for ongoing prenatal care  Allergies as of 01/17/2024   No Known Allergies      Medication List     STOP taking these medications    famotidine 20 MG tablet Commonly known as: PEPCID       TAKE these medications    aspirin EC 81 MG tablet Take 2 tablets (162 mg total) by mouth daily. Swallow whole.   labetalol 100 MG tablet Commonly known as: NORMODYNE Take 2 tablets (200 mg total) by mouth 2 (two) times daily. What changed: how much to take   nitrofurantoin (macrocrystal-monohydrate) 100 MG capsule Commonly known as: MACROBID Take 1 capsule (100 mg total) by mouth every 12 (twelve) hours.   pantoprazole 20 MG tablet Commonly known as: PROTONIX Take 1 tablet (20 mg total) by mouth daily.   Prenatal 28-0.8 MG Tabs Take 1 tablet by mouth daily.        Ebony Goldstein, MD 01/17/2024 10:30 PM

## 2024-01-18 LAB — URINE CULTURE

## 2024-01-20 ENCOUNTER — Telehealth: Payer: Self-pay | Admitting: *Deleted

## 2024-01-20 ENCOUNTER — Other Ambulatory Visit (HOSPITAL_COMMUNITY): Payer: Self-pay | Admitting: Family Medicine

## 2024-01-20 DIAGNOSIS — B9689 Other specified bacterial agents as the cause of diseases classified elsewhere: Secondary | ICD-10-CM

## 2024-01-20 LAB — CULTURE, OB URINE
Culture: >=100000 — AB
Special Requests: NORMAL

## 2024-01-20 MED ORDER — CEFADROXIL 500 MG PO CAPS
500.0000 mg | ORAL_CAPSULE | Freq: Two times a day (BID) | ORAL | 0 refills | Status: AC
Start: 2024-01-20 — End: 2024-01-27

## 2024-01-20 NOTE — Progress Notes (Signed)
 Hello Anita Herrera, I just received your culture results for your urine.  I see that it is growing a bacteria called Klebsiella Pneumoniae.  Unfortunately, I'll need to change your antibiotic to specifically target this bug causing your urinary tract infection.  I've sent this into the [facility] on file.  Hope you're feeling better.  Please stop the Macrobid and switch to the new antibiotic.  Please finish the new antibiotic until completely gone as well.   Best, Dr. Scherrie Curt

## 2024-01-20 NOTE — Telephone Encounter (Signed)
 RTC. Pt concerned for blood in urine. Recent DX of UTI in pregnancy. Culture resulted and antibiotics changed. Pt reports pharmacy is doing only a partial fill of the med today due to low in stock. Will complete fill on Thursday. Advised pt if pharmacy unable to give meds from partial fill today, then she needs to request RX transfer due to importance of beginning therapy ASAP. Pt advised of risk of and signs for pyelonephritis in pregnancy. Advised overhydration. Pt verbalized understanding. Pt to call office if needs further assistance with RX fill.

## 2024-01-21 ENCOUNTER — Other Ambulatory Visit: Payer: Self-pay

## 2024-01-21 ENCOUNTER — Other Ambulatory Visit: Payer: Self-pay | Admitting: *Deleted

## 2024-01-21 ENCOUNTER — Ambulatory Visit

## 2024-01-21 DIAGNOSIS — O10919 Unspecified pre-existing hypertension complicating pregnancy, unspecified trimester: Secondary | ICD-10-CM

## 2024-01-21 DIAGNOSIS — O0933 Supervision of pregnancy with insufficient antenatal care, third trimester: Secondary | ICD-10-CM | POA: Diagnosis not present

## 2024-01-21 DIAGNOSIS — O99210 Obesity complicating pregnancy, unspecified trimester: Secondary | ICD-10-CM

## 2024-01-21 DIAGNOSIS — O0993 Supervision of high risk pregnancy, unspecified, third trimester: Secondary | ICD-10-CM | POA: Diagnosis not present

## 2024-01-21 DIAGNOSIS — O99213 Obesity complicating pregnancy, third trimester: Secondary | ICD-10-CM | POA: Diagnosis not present

## 2024-01-21 DIAGNOSIS — O10913 Unspecified pre-existing hypertension complicating pregnancy, third trimester: Secondary | ICD-10-CM

## 2024-01-21 DIAGNOSIS — Z3A32 32 weeks gestation of pregnancy: Secondary | ICD-10-CM

## 2024-01-21 DIAGNOSIS — O0990 Supervision of high risk pregnancy, unspecified, unspecified trimester: Secondary | ICD-10-CM

## 2024-01-21 DIAGNOSIS — O283 Abnormal ultrasonic finding on antenatal screening of mother: Secondary | ICD-10-CM

## 2024-01-21 DIAGNOSIS — O0930 Supervision of pregnancy with insufficient antenatal care, unspecified trimester: Secondary | ICD-10-CM

## 2024-01-23 ENCOUNTER — Ambulatory Visit: Admitting: Cardiology

## 2024-01-28 ENCOUNTER — Other Ambulatory Visit: Payer: Self-pay | Admitting: *Deleted

## 2024-01-28 ENCOUNTER — Ambulatory Visit

## 2024-01-28 ENCOUNTER — Ambulatory Visit: Attending: Obstetrics and Gynecology | Admitting: Obstetrics and Gynecology

## 2024-01-28 VITALS — BP 127/65 | HR 101

## 2024-01-28 DIAGNOSIS — Z3A33 33 weeks gestation of pregnancy: Secondary | ICD-10-CM

## 2024-01-28 DIAGNOSIS — O10013 Pre-existing essential hypertension complicating pregnancy, third trimester: Secondary | ICD-10-CM | POA: Diagnosis not present

## 2024-01-28 DIAGNOSIS — O403XX0 Polyhydramnios, third trimester, not applicable or unspecified: Secondary | ICD-10-CM

## 2024-01-28 DIAGNOSIS — O99213 Obesity complicating pregnancy, third trimester: Secondary | ICD-10-CM

## 2024-01-28 DIAGNOSIS — Z3A39 39 weeks gestation of pregnancy: Secondary | ICD-10-CM | POA: Insufficient documentation

## 2024-01-28 DIAGNOSIS — O0990 Supervision of high risk pregnancy, unspecified, unspecified trimester: Secondary | ICD-10-CM

## 2024-01-28 DIAGNOSIS — O10919 Unspecified pre-existing hypertension complicating pregnancy, unspecified trimester: Secondary | ICD-10-CM

## 2024-01-28 DIAGNOSIS — Z79899 Other long term (current) drug therapy: Secondary | ICD-10-CM | POA: Insufficient documentation

## 2024-01-28 DIAGNOSIS — O10913 Unspecified pre-existing hypertension complicating pregnancy, third trimester: Secondary | ICD-10-CM

## 2024-01-28 DIAGNOSIS — E669 Obesity, unspecified: Secondary | ICD-10-CM | POA: Diagnosis not present

## 2024-01-28 DIAGNOSIS — O09293 Supervision of pregnancy with other poor reproductive or obstetric history, third trimester: Secondary | ICD-10-CM

## 2024-01-28 NOTE — Progress Notes (Signed)
 Maternal-Fetal Medicine Consultation Name: Anita Herrera MRN: [id number]  G8 P3043 at 33w 4d gestation. -Chronic hypertension.  Patient is being managed with diagnosis of chronic hypertension (not gestational hypertension).  She takes labetalol  100 mg twice daily and reports her blood pressures are within normal range.  Blood pressure today at our office is 127/65 mmHg. -Echogenic bowel.  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  -Mild polyhydramnios. -Pregravid BMI 42.  Ultrasound Fetal growth is appropriate for gestational age.  Amniotic fluid is normal good fetal activity seen.  Antenatal testing is reassuring.  BPP 8/8.  Fetal bowel appears normal with no evidence of hyperechogenicity  Chronic hypertension I encouraged the patient to take labetalol  regularly to control hypertension.  Provided hypertension is well-controlled, patient can be delivered at [redacted] weeks gestation.  Superimposed preeclampsia can occur in up to 50% of cases after [redacted] weeks gestation.  If patient requires increasing doses of antihypertensives, she should be delivered at 37- or 38-weeks' gestation.  Echogenic bowel I reassured the patient of normal bowel findings.  I discussed the significance of talks of plasma cells and CMV serologies.  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 She is toxoplasmosis nonimmune.  I discussed preventative measures including avoidance of eating undercooked meat, unwashed vegetables, and disposal of cat litter.  I reassured the patient that polyhydramnios had resolved.  Recommendations - Continue weekly antenatal testing till delivery. - Delivery at [redacted] weeks gestation provide her blood pressures are well-controlled.  Early term delivery (37 to 38 weeks) may be considered if control of hypertension requires increasing doses of  antihypertensives.  Consultation including face-to-face (more than 50%) counseling 30 minutes.

## 2024-01-29 ENCOUNTER — Ambulatory Visit (INDEPENDENT_AMBULATORY_CARE_PROVIDER_SITE_OTHER): Admitting: Obstetrics and Gynecology

## 2024-01-29 ENCOUNTER — Encounter: Payer: Self-pay | Admitting: Obstetrics and Gynecology

## 2024-01-29 VITALS — BP 132/77 | HR 92 | Wt 288.8 lb

## 2024-01-29 DIAGNOSIS — Z8759 Personal history of other complications of pregnancy, childbirth and the puerperium: Secondary | ICD-10-CM

## 2024-01-29 DIAGNOSIS — O99210 Obesity complicating pregnancy, unspecified trimester: Secondary | ICD-10-CM | POA: Diagnosis not present

## 2024-01-29 DIAGNOSIS — O0990 Supervision of high risk pregnancy, unspecified, unspecified trimester: Secondary | ICD-10-CM | POA: Diagnosis not present

## 2024-01-29 DIAGNOSIS — O10919 Unspecified pre-existing hypertension complicating pregnancy, unspecified trimester: Secondary | ICD-10-CM

## 2024-01-29 NOTE — Progress Notes (Signed)
   PRENATAL VISIT NOTE  Subjective:  Anita Herrera is a 28 y.o. [phone] at [redacted]w[redacted]d being seen today for ongoing prenatal care.  She is currently monitored for the following issues for this high-risk pregnancy and has LGSIL of cervix of undetermined significance; History of shoulder dystocia in prior pregnancy-LGA; Supervision of high risk pregnancy, antepartum; Late prenatal care affecting pregnancy; Obesity affecting pregnancy, antepartum; History of gestational hypertension; Chronic hypertension affecting pregnancy; and Abnormal fetal ultrasound, echogenic bowel on their problem list.  Patient reports no complaints.  Contractions: Not present. Vag. Bleeding: None.  Movement: Present. Denies leaking of fluid.   The following portions of the patient's history were reviewed and updated as appropriate: allergies, current medications, past family history, past medical history, past social history, past surgical history and problem list.   Objective:   Vitals:   01/29/24 1513  BP: 132/77  Pulse: 92  Weight: 288 lb 12.8 oz (131 kg)    Fetal Status: Fetal Heart Rate (bpm): 131 Fundal Height: 33 cm Movement: Present     General:  Alert, oriented and cooperative. Patient is in no acute distress.  Skin: Skin is warm and dry. No rash noted.   Cardiovascular: Normal heart rate noted  Respiratory: Normal respiratory effort, no problems with respiration noted  Abdomen: Soft, gravid, appropriate for gestational age.  Pain/Pressure: Absent     Pelvic: Cervical exam deferred        Extremities: Normal range of motion.  Edema: None  Mental Status: Normal mood and affect. Normal behavior. Normal judgment and thought content.   Assessment and Plan:  Pregnancy: [name] at [redacted]w[redacted]d 1. Supervision of high risk pregnancy, antepartum (Primary) Patient is doing well without complaints Patient requesting urine culture  2. Chronic hypertension affecting pregnancy BP stable on labetalol  Continue ASA Follow  up growth and continue antenatal testing per MFM schedule Plan for IOL at 39 weeks   3. Obesity affecting pregnancy, antepartum, unspecified obesity type   4. History of shoulder dystocia in prior pregnancy-LGA EFW 01/28/24- 50%tile  Preterm labor symptoms and general obstetric precautions including but not limited to vaginal bleeding, contractions, leaking of fluid and fetal movement were reviewed in detail with the patient. Please refer to After Visit Summary for other counseling recommendations.   Return in about 2 weeks (around 02/12/2024) for in person, ROB, High risk.  Future Appointments  Date Time Provider Department Center  02/04/2024  1:00 PM [facility] PROVIDER 1 [facility]  02/04/2024  1:30 PM [facility] US4 [facility]  02/11/2024  1:00 PM [facility] PROVIDER 1 [facility]  02/11/2024  1:30 PM [facility] US6 [facility]  02/13/2024  2:20 PM Tobb, Chyrl Crawford, DO [facility] None    Verlyn Goad, MD

## 2024-02-02 LAB — CULTURE, OB URINE

## 2024-02-02 LAB — URINE CULTURE, OB REFLEX

## 2024-02-02 MED ORDER — CEPHALEXIN 500 MG PO CAPS: 500.0000 mg | ORAL_CAPSULE | Freq: Four times a day (QID) | ORAL | 2 refills | Status: DC

## 2024-02-02 NOTE — Addendum Note (Signed)
 Addended by: Verlyn Goad on: 02/02/2024 08:45 AM   Modules accepted: Orders

## 2024-02-02 NOTE — Progress Notes (Signed)
 TC to inform pt of UTI and RX sent. Encourage to pick up RX ASAP and drink plenty of water. Preterm labor precautions given.

## 2024-02-04 ENCOUNTER — Ambulatory Visit (HOSPITAL_BASED_OUTPATIENT_CLINIC_OR_DEPARTMENT_OTHER): Attending: Obstetrics and Gynecology | Admitting: Maternal & Fetal Medicine

## 2024-02-04 ENCOUNTER — Ambulatory Visit

## 2024-02-04 ENCOUNTER — Other Ambulatory Visit: Payer: Self-pay | Admitting: *Deleted

## 2024-02-04 VITALS — BP 131/56 | HR 76

## 2024-02-04 DIAGNOSIS — O10013 Pre-existing essential hypertension complicating pregnancy, third trimester: Secondary | ICD-10-CM

## 2024-02-04 DIAGNOSIS — O10913 Unspecified pre-existing hypertension complicating pregnancy, third trimester: Secondary | ICD-10-CM

## 2024-02-04 DIAGNOSIS — O99213 Obesity complicating pregnancy, third trimester: Secondary | ICD-10-CM | POA: Insufficient documentation

## 2024-02-04 DIAGNOSIS — O09293 Supervision of pregnancy with other poor reproductive or obstetric history, third trimester: Secondary | ICD-10-CM | POA: Diagnosis not present

## 2024-02-04 DIAGNOSIS — O10919 Unspecified pre-existing hypertension complicating pregnancy, unspecified trimester: Secondary | ICD-10-CM

## 2024-02-04 DIAGNOSIS — Z362 Encounter for other antenatal screening follow-up: Secondary | ICD-10-CM | POA: Diagnosis not present

## 2024-02-04 DIAGNOSIS — Z3A34 34 weeks gestation of pregnancy: Secondary | ICD-10-CM

## 2024-02-04 DIAGNOSIS — O0933 Supervision of pregnancy with insufficient antenatal care, third trimester: Secondary | ICD-10-CM | POA: Diagnosis not present

## 2024-02-04 DIAGNOSIS — O403XX0 Polyhydramnios, third trimester, not applicable or unspecified: Secondary | ICD-10-CM | POA: Diagnosis not present

## 2024-02-04 DIAGNOSIS — E669 Obesity, unspecified: Secondary | ICD-10-CM | POA: Diagnosis not present

## 2024-02-04 DIAGNOSIS — O0990 Supervision of high risk pregnancy, unspecified, unspecified trimester: Secondary | ICD-10-CM

## 2024-02-04 NOTE — Progress Notes (Signed)
 After review, MFM consult with provider is not indicated for today  Anita Bowling, DO 02/04/2024 1:53 PM  [facility]

## 2024-02-11 ENCOUNTER — Ambulatory Visit (HOSPITAL_BASED_OUTPATIENT_CLINIC_OR_DEPARTMENT_OTHER): Admitting: Maternal & Fetal Medicine

## 2024-02-11 ENCOUNTER — Ambulatory Visit

## 2024-02-11 ENCOUNTER — Ambulatory Visit: Attending: Obstetrics and Gynecology

## 2024-02-11 VITALS — BP 149/66

## 2024-02-11 VITALS — BP 141/64 | HR 79

## 2024-02-11 DIAGNOSIS — O99210 Obesity complicating pregnancy, unspecified trimester: Secondary | ICD-10-CM

## 2024-02-11 DIAGNOSIS — Z3A35 35 weeks gestation of pregnancy: Secondary | ICD-10-CM

## 2024-02-11 DIAGNOSIS — O10913 Unspecified pre-existing hypertension complicating pregnancy, third trimester: Secondary | ICD-10-CM

## 2024-02-11 DIAGNOSIS — O0990 Supervision of high risk pregnancy, unspecified, unspecified trimester: Secondary | ICD-10-CM

## 2024-02-11 DIAGNOSIS — O10013 Pre-existing essential hypertension complicating pregnancy, third trimester: Secondary | ICD-10-CM | POA: Insufficient documentation

## 2024-02-11 NOTE — Procedures (Signed)
 Anita Herrera 10-Aug-1996 [redacted]w[redacted]d  Fetus A Non-Stress Test Interpretation for 02/11/24  Indication: Chronic Hypertenstion  Fetal Heart Rate A Mode: External Baseline Rate (A): 125 bpm Variability: Moderate Accelerations: 15 x 15 Decelerations: None Multiple birth?: No  Uterine Activity Mode: Palpation, Toco Contraction Frequency (min): None noted Resting Tone Palpated: Relaxed  Interpretation (Fetal Testing) Nonstress Test Interpretation: Reactive Comments: Reviewed with Dr. Nolan Battle

## 2024-02-12 ENCOUNTER — Encounter: Admitting: Obstetrics & Gynecology

## 2024-02-13 ENCOUNTER — Encounter: Payer: Self-pay | Admitting: Cardiology

## 2024-02-13 ENCOUNTER — Ambulatory Visit (INDEPENDENT_AMBULATORY_CARE_PROVIDER_SITE_OTHER): Admitting: Cardiology

## 2024-02-13 VITALS — BP 121/82 | HR 80 | Ht 66.0 in | Wt 287.1 lb

## 2024-02-13 DIAGNOSIS — E661 Drug-induced obesity: Secondary | ICD-10-CM

## 2024-02-13 DIAGNOSIS — R0609 Other forms of dyspnea: Secondary | ICD-10-CM | POA: Diagnosis not present

## 2024-02-13 DIAGNOSIS — Z3A35 35 weeks gestation of pregnancy: Secondary | ICD-10-CM

## 2024-02-13 DIAGNOSIS — O99210 Obesity complicating pregnancy, unspecified trimester: Secondary | ICD-10-CM | POA: Diagnosis not present

## 2024-02-13 DIAGNOSIS — O10919 Unspecified pre-existing hypertension complicating pregnancy, unspecified trimester: Secondary | ICD-10-CM

## 2024-02-13 NOTE — Progress Notes (Signed)
 After review, MFM consult with provider is not indicated for today  Anita Bowling, DO 02/13/2024 1:51 PM  [facility]

## 2024-02-13 NOTE — Progress Notes (Unsigned)
 Cardio-Obstetrics Clinic  {Choose New Eval or Follow Up Note:[phone]}   Prior CV Studies Reviewed: The following studies were reviewed today: ***  Past Medical History:  Diagnosis Date   Anemia    Shingles    Vaginitis     Past Surgical History:  Procedure Laterality Date   WISDOM TOOTH EXTRACTION     { Click here to update PMH, PSH, OB Hx then refresh note  :1}   OB History     Gravida  8   Para  3   Term  3   Preterm  0   AB  4   Living  3      SAB  1   IAB  3   Ectopic  0   Multiple  0   Live Births  3           { Click here to update OB Charting then refresh note  :1}    Current Medications: Current Meds  Medication Sig   aspirin  EC 81 MG tablet Take 2 tablets (162 mg total) by mouth daily. Swallow whole.   labetalol  (NORMODYNE ) 100 MG tablet Take 2 tablets (200 mg total) by mouth 2 (two) times daily.   pantoprazole  (PROTONIX ) 20 MG tablet Take 1 tablet (20 mg total) by mouth daily.   Prenatal 28-0.8 MG TABS Take 1 tablet by mouth daily.     Allergies:   Patient has no known allergies.   Social History   Socioeconomic History   Marital status: Single    Spouse name: Not on file   Number of children: Not on file   Years of education: Not on file   Highest education level: Not on file  Occupational History   Not on file  Tobacco Use   Smoking status: Former    Current packs/day: 0.00    Types: Cigarettes    Quit date: 07/2023    Years since quitting: 0.6   Smokeless tobacco: Never  Vaping Use   Vaping status: Never Used  Substance and Sexual Activity   Alcohol use: Not Currently    Alcohol/week: 2.0 standard drinks of alcohol    Types: 2 Shots of liquor per week    Comment: Not since confirmed pregnancy   Drug use: No   Sexual activity: Yes  Other Topics Concern   Not on file  Social History Narrative         Social Drivers of Health   Financial Resource Strain: Not on file  Food Insecurity: No Food  Insecurity (08/06/2022)   Hunger Vital Sign    Worried About Running Out of Food in the Last Year: Never true    Ran Out of Food in the Last Year: Never true  Transportation Needs: No Transportation Needs (08/06/2022)   PRAPARE - Administrator, Civil Service (Medical): No    Lack of Transportation (Non-Medical): No  Physical Activity: Not on file  Stress: Not on file  Social Connections: Not on file  { Click here to update SDOH then refresh :1}    Family History  Problem Relation Age of Onset   Diabetes Mother    Hypertension Maternal Grandmother    Asthma Neg Hx    Cancer Neg Hx    Birth defects Neg Hx    Heart disease Neg Hx    Stroke Neg Hx    { Click here to update FH then refresh note    :1}   ROS:   Please  see the history of present illness.    *** All other systems reviewed and are negative.   Labs/EKG Reviewed:    EKG:   EKG is *** ordered today.  The ekg ordered today demonstrates ***  Recent Labs: 01/17/2024: ALT 46; B Natriuretic Peptide 15.0; BUN 9; Creatinine, Ser 0.46; Hemoglobin 10.3; Platelets 220; Potassium 4.1; Sodium 135   Recent Lipid Panel No results found for: "CHOL", "TRIG", "HDL", "CHOLHDL", "LDLCALC", "LDLDIRECT"  Physical Exam:    VS:  BP 121/82 (BP Location: Left Arm, Patient Position: Sitting, Cuff Size: Large)   Pulse 80   Ht 5\' 6"  (1.676 m)   Wt 287 lb 1.6 oz (130.2 kg)   LMP 06/06/2023   SpO2 96%   BMI 46.34 kg/m     Wt Readings from Last 3 Encounters:  02/13/24 287 lb 1.6 oz (130.2 kg)  01/29/24 288 lb 12.8 oz (131 kg)  01/17/24 287 lb 9.6 oz (130.5 kg)     GEN: *** Well nourished, well developed in no acute distress HEENT: Normal NECK: No JVD; No carotid bruits LYMPHATICS: No lymphadenopathy CARDIAC: ***RRR, no murmurs, rubs, gallops RESPIRATORY:  Clear to auscultation without rales, wheezing or rhonchi  ABDOMEN: Soft, non-tender, non-distended MUSCULOSKELETAL:  No edema; No deformity  SKIN: Warm and  dry NEUROLOGIC:  Alert and oriented x 3 PSYCHIATRIC:  Normal affect    Risk Assessment/Risk Calculators:   { Click to calculate CARPREG II - THEN refresh note :1}    { Click to caclulate Mod WHO Class of CV Risk - THEN refresh note :1}     { Click for CHADS2VASc Score - THEN Refresh Note    :[id number]}      ASSESSMENT & PLAN:    *** There are no Patient Instructions on file for this visit.   Dispo:  No follow-ups on file.   Medication Adjustments/Labs and Tests Ordered: Current medicines are reviewed at length with the patient today.  Concerns regarding medicines are outlined above.  Tests Ordered: No orders of the defined types were placed in this encounter.  Medication Changes: No orders of the defined types were placed in this encounter.

## 2024-02-13 NOTE — Patient Instructions (Signed)
 Medication Instructions:  Your physician recommends that you continue on your current medications as directed. Please refer to the Current Medication list given to you today.  *If you need a refill on your cardiac medications before your next appointment, please call your pharmacy*  Testing/Procedures: Your physician has requested that you have an echocardiogram -OB. Echocardiography is a painless test that uses sound waves to create images of your heart. It provides your doctor with information about the size and shape of your heart and how well your heart's chambers and valves are working. This procedure takes approximately one hour. There are no restrictions for this procedure. Please do NOT wear cologne, perfume, aftershave, or lotions (deodorant is allowed). Please arrive 15 minutes prior to your appointment time.  Please note: We ask at that you not bring children with you during ultrasound (echo/ vascular) testing. Due to room size and safety concerns, children are not allowed in the ultrasound rooms during exams. Our front office staff cannot provide observation of children in our lobby area while testing is being conducted. An adult accompanying a patient to their appointment will only be allowed in the ultrasound room at the discretion of the ultrasound technician under special circumstances. We apologize for any inconvenience.   Follow-Up: At [facility], you and your health needs are our priority.  As part of our continuing mission to provide you with exceptional heart care, our providers are all part of one team.  This team includes your primary Cardiologist (physician) and Advanced Practice Providers or APPs (Physician Assistants and Nurse Practitioners) who all work together to provide you with the care you need, when you need it.  Your next appointment:   May 22nd at 9:40am  Provider:   Kardie Tobb, DO    We recommend signing up for the patient portal called "MyChart".   Sign up information is provided on this After Visit Summary.  MyChart is used to connect with patients for Virtual Visits (Telemedicine).  Patients are able to view lab/test results, encounter notes, upcoming appointments, etc.  Non-urgent messages can be sent to your provider as well.   To learn more about what you can do with MyChart, go to [email or web address].   Other Instructions: Please take your blood pressure daily then send a MyChart message on Thursday. Please include heart rates.  HOW TO TAKE YOUR BLOOD PRESSURE: Rest 5 minutes before taking your blood pressure. Don't smoke or drink caffeinated beverages for at least 30 minutes before. Take your blood pressure before (not after) you eat. Sit comfortably with your back supported and both feet on the floor (don't cross your legs). Elevate your arm to heart level on a table or a desk. Use the proper sized cuff. It should fit smoothly and snugly around your bare upper arm. There should be enough room to slip a fingertip under the cuff. The bottom edge of the cuff should be 1 inch above the crease of the elbow. Ideally, take 3 measurements at one sitting and record the average.

## 2024-02-16 ENCOUNTER — Telehealth: Payer: Self-pay | Admitting: *Deleted

## 2024-02-16 NOTE — Telephone Encounter (Signed)
 TC to pt to follow up on elevated BP reported in Babys[employer]s. Pt reports she rechecked BP and it was normal. Reports normal BP this AM. Denies pre E symptoms.

## 2024-02-17 ENCOUNTER — Telehealth: Payer: Self-pay

## 2024-02-17 NOTE — Telephone Encounter (Signed)
 Pt was phoned due to Baby RX of 140/76 BP. Pt states she retook BP 15 minutes later and was 139/78.  Pt denies any s/s of pre-E and was advised to seek care at [facility] if she experiences HA, RUQ pain, dizziness, or vision changes. Pt has an appt in this office on 02/20/24

## 2024-02-18 ENCOUNTER — Ambulatory Visit (HOSPITAL_BASED_OUTPATIENT_CLINIC_OR_DEPARTMENT_OTHER): Admitting: Obstetrics and Gynecology

## 2024-02-18 ENCOUNTER — Ambulatory Visit: Attending: Obstetrics and Gynecology | Admitting: *Deleted

## 2024-02-18 ENCOUNTER — Other Ambulatory Visit: Payer: Self-pay | Admitting: *Deleted

## 2024-02-18 VITALS — BP 141/65 | HR 83

## 2024-02-18 VITALS — BP 142/63

## 2024-02-18 DIAGNOSIS — O10013 Pre-existing essential hypertension complicating pregnancy, third trimester: Secondary | ICD-10-CM | POA: Diagnosis present

## 2024-02-18 DIAGNOSIS — O10919 Unspecified pre-existing hypertension complicating pregnancy, unspecified trimester: Secondary | ICD-10-CM | POA: Diagnosis not present

## 2024-02-18 DIAGNOSIS — O10913 Unspecified pre-existing hypertension complicating pregnancy, third trimester: Secondary | ICD-10-CM

## 2024-02-18 DIAGNOSIS — O0990 Supervision of high risk pregnancy, unspecified, unspecified trimester: Secondary | ICD-10-CM

## 2024-02-18 DIAGNOSIS — Z3A36 36 weeks gestation of pregnancy: Secondary | ICD-10-CM | POA: Insufficient documentation

## 2024-02-18 NOTE — Progress Notes (Signed)
 After review, MFM consult with provider is not indicated for today  Cassandria Clever, MD 02/18/2024 4:49 PM  [facility]

## 2024-02-18 NOTE — Procedures (Signed)
 Anita Herrera [birth year] [redacted]w[redacted]d  Fetus A Non-Stress Test Interpretation for 02/18/24  NST only  Indication: Chronic Hypertenstion  Fetal Heart Rate A Mode: External Baseline Rate (A): 115 bpm Variability: Moderate Accelerations: 15 x 15 Decelerations: Variable Multiple birth?: No  Uterine Activity Mode: Palpation, Toco Contraction Frequency (min): none Resting Tone Palpated: Relaxed  Interpretation (Fetal Testing) Nonstress Test Interpretation: Reactive Overall Impression: Reassuring for gestational age Comments: Dr. Arnie Bibber reviewed tracing

## 2024-02-19 ENCOUNTER — Encounter: Payer: Self-pay | Admitting: Cardiology

## 2024-02-20 ENCOUNTER — Other Ambulatory Visit (HOSPITAL_COMMUNITY)
Admission: RE | Admit: 2024-02-20 | Discharge: 2024-02-20 | Disposition: A | Discharge status: Home / Self Care | Source: Ambulatory Visit | Attending: Obstetrics & Gynecology | Admitting: Obstetrics & Gynecology

## 2024-02-20 ENCOUNTER — Ambulatory Visit (INDEPENDENT_AMBULATORY_CARE_PROVIDER_SITE_OTHER): Admitting: Obstetrics & Gynecology

## 2024-02-20 VITALS — BP 118/72 | HR 79 | Wt 296.0 lb

## 2024-02-20 DIAGNOSIS — O0990 Supervision of high risk pregnancy, unspecified, unspecified trimester: Secondary | ICD-10-CM | POA: Insufficient documentation

## 2024-02-20 DIAGNOSIS — O99891 Other specified diseases and conditions complicating pregnancy: Secondary | ICD-10-CM | POA: Diagnosis not present

## 2024-02-20 DIAGNOSIS — Z3A37 37 weeks gestation of pregnancy: Secondary | ICD-10-CM | POA: Insufficient documentation

## 2024-02-20 DIAGNOSIS — R8271 Bacteriuria: Secondary | ICD-10-CM | POA: Insufficient documentation

## 2024-02-20 DIAGNOSIS — O10919 Unspecified pre-existing hypertension complicating pregnancy, unspecified trimester: Secondary | ICD-10-CM

## 2024-02-20 DIAGNOSIS — O99820 Streptococcus B carrier state complicating pregnancy: Secondary | ICD-10-CM

## 2024-02-20 DIAGNOSIS — O99210 Obesity complicating pregnancy, unspecified trimester: Secondary | ICD-10-CM

## 2024-02-20 MED ORDER — CEPHALEXIN 500 MG PO CAPS: 500.0000 mg | ORAL_CAPSULE | Freq: Two times a day (BID) | ORAL | 1 refills | Status: DC

## 2024-02-20 NOTE — Progress Notes (Signed)
   PRENATAL VISIT NOTE  Subjective:  Anita Herrera is a 28 y.o. [phone] at [redacted]w[redacted]d being seen today for ongoing prenatal care.  She is currently monitored for the following issues for this high-risk pregnancy and has LGSIL of cervix of undetermined significance; History of shoulder dystocia in prior pregnancy-LGA; Supervision of high risk pregnancy, antepartum; Late prenatal care affecting pregnancy; Obesity affecting pregnancy, antepartum; Chronic hypertension affecting pregnancy; Abnormal fetal ultrasound, echogenic bowel; and Asymptomatic bacteriuria during pregnancy on their problem list.  Patient reports no complaints.  Contractions: Irregular. Vag. Bleeding: None.  Movement: Present. Denies leaking of fluid.   The following portions of the patient's history were reviewed and updated as appropriate: allergies, current medications, past family history, past medical history, past social history, past surgical history and problem list.   Objective:    Vitals:   02/20/24 1012  BP: 118/72  Pulse: 79  Weight: 296 lb (134.3 kg)    Fetal Status:  Fetal Heart Rate (bpm): 135   Movement: Present Presentation: Vertex  General: Alert, oriented and cooperative. Patient is in no acute distress.  Skin: Skin is warm and dry. No rash noted.   Cardiovascular: Normal heart rate noted  Respiratory: Normal respiratory effort, no problems with respiration noted  Abdomen: Soft, gravid, appropriate for gestational age.  Pain/Pressure: Present     Pelvic: Cervical exam performed in the presence of a chaperone Dilation: 1.5 Effacement (%): 40 Station: -3  Extremities: Normal range of motion.     Mental Status: Normal mood and affect. Normal behavior. Normal judgment and thought content.   Assessment and Plan:  Pregnancy: [id number] at [redacted]w[redacted]d 1. Supervision of high risk pregnancy, antepartum (Primary)  - Cervicovaginal ancillary only( Anita Herrera) - Culture, beta strep (group b only) - Culture, OB  Urine  2. [redacted] weeks gestation of pregnancy  - Cervicovaginal ancillary only( Anita Herrera) - Culture, beta strep (group b only) - Culture, OB Urine  3. Asymptomatic bacteriuria during pregnancy RX Keflex   4. Chronic hypertension affecting pregnancy Labetalol , IOL 39 week  5. Obesity affecting pregnancy, antepartum, unspecified obesity type   Preterm labor symptoms and general obstetric precautions including but not limited to vaginal bleeding, contractions, leaking of fluid and fetal movement were reviewed in detail with the patient. Please refer to After Visit Summary for other counseling recommendations.   Return in about 1 week (around 02/27/2024).  Future Appointments  Date Time Provider Department Center  02/24/2024  2:00 PM [facility] PROVIDER 1 [facility]  02/24/2024  2:30 PM [facility] US3 [facility]  02/26/2024  6:15 AM [facility] 1 [facility]  02/26/2024  9:40 AM Tobb, Kardie, DO [facility] H&V    Onnie Bilis, MD

## 2024-02-23 DIAGNOSIS — O99820 Streptococcus B carrier state complicating pregnancy: Secondary | ICD-10-CM | POA: Insufficient documentation

## 2024-02-23 LAB — CERVICOVAGINAL ANCILLARY ONLY
Bacterial Vaginitis (gardnerella): NEGATIVE
Candida Glabrata: NEGATIVE
Candida Vaginitis: NEGATIVE
Chlamydia: NEGATIVE
Comment: NEGATIVE
Comment: NEGATIVE
Comment: NEGATIVE
Comment: NEGATIVE
Comment: NEGATIVE
Comment: NORMAL
Neisseria Gonorrhea: NEGATIVE
Trichomonas: NEGATIVE

## 2024-02-23 LAB — CULTURE, BETA STREP (GROUP B ONLY): Strep Gp B Culture: POSITIVE — AB

## 2024-02-24 ENCOUNTER — Ambulatory Visit (HOSPITAL_BASED_OUTPATIENT_CLINIC_OR_DEPARTMENT_OTHER)

## 2024-02-24 ENCOUNTER — Ambulatory Visit: Attending: Obstetrics and Gynecology | Admitting: Obstetrics and Gynecology

## 2024-02-24 VITALS — BP 122/57 | HR 75

## 2024-02-24 DIAGNOSIS — O10913 Unspecified pre-existing hypertension complicating pregnancy, third trimester: Secondary | ICD-10-CM | POA: Insufficient documentation

## 2024-02-24 DIAGNOSIS — O09293 Supervision of pregnancy with other poor reproductive or obstetric history, third trimester: Secondary | ICD-10-CM | POA: Insufficient documentation

## 2024-02-24 DIAGNOSIS — O99213 Obesity complicating pregnancy, third trimester: Secondary | ICD-10-CM | POA: Diagnosis not present

## 2024-02-24 DIAGNOSIS — Z3A37 37 weeks gestation of pregnancy: Secondary | ICD-10-CM | POA: Diagnosis not present

## 2024-02-24 DIAGNOSIS — O0933 Supervision of pregnancy with insufficient antenatal care, third trimester: Secondary | ICD-10-CM | POA: Insufficient documentation

## 2024-02-24 DIAGNOSIS — O10013 Pre-existing essential hypertension complicating pregnancy, third trimester: Secondary | ICD-10-CM

## 2024-02-24 DIAGNOSIS — Z79899 Other long term (current) drug therapy: Secondary | ICD-10-CM | POA: Insufficient documentation

## 2024-02-24 DIAGNOSIS — O0990 Supervision of high risk pregnancy, unspecified, unspecified trimester: Secondary | ICD-10-CM | POA: Diagnosis not present

## 2024-02-24 DIAGNOSIS — O403XX0 Polyhydramnios, third trimester, not applicable or unspecified: Secondary | ICD-10-CM

## 2024-02-24 DIAGNOSIS — E669 Obesity, unspecified: Secondary | ICD-10-CM | POA: Diagnosis not present

## 2024-02-24 LAB — URINE CULTURE, OB REFLEX

## 2024-02-24 LAB — CULTURE, OB URINE

## 2024-02-24 NOTE — Progress Notes (Signed)
  Maternal-Fetal Medicine Consultation Name: Anita Herrera MRN: [id number]  G8 [id number] at 37w 3d gestation. - Chronic hypertension.  Patient takes labetalol  100 mg twice daily.  Blood pressure today at our office is 122/57 mmHg.  She does not have signs and symptoms of severe features of preeclampsia. - Echogenic bowel and polyhydramnios have resolved. - Pregravid BMI 42.  Ultrasound The estimated fetal weight is at the 91st percentile and the abdominal circumference measurement at the 99th percentile.  Amniotic fluid is normal good fetal activity seen.  Cephalic presentation.  Antenatal testing is reassuring.  BPP 8/8.  I reassured the patient of the findings.  In the absence of severe hypertension and symptoms, induction of labor can be deferred to [redacted] weeks gestation.  Patient was concerned about fetal weight.  I explained that ultrasound has limitations in accurately estimating fetal weights.  Given that she had 3 vaginal deliveries, she is likely to have a successful vaginal delivery.  Recommendations - I recommend NST to be performed at your office on 02/26/2024. -No follow-up appointments were made at our office. - Induction of labor as scheduled.   Consultation including face-to-face (more than 50%) counseling 10 minutes.

## 2024-02-26 ENCOUNTER — Ambulatory Visit (INDEPENDENT_AMBULATORY_CARE_PROVIDER_SITE_OTHER): Attending: Cardiology | Admitting: Cardiology

## 2024-02-26 ENCOUNTER — Ambulatory Visit: Admitting: Obstetrics and Gynecology

## 2024-02-26 ENCOUNTER — Telehealth: Payer: Self-pay | Admitting: Pharmacist

## 2024-02-26 ENCOUNTER — Encounter: Payer: Self-pay | Admitting: Obstetrics and Gynecology

## 2024-02-26 ENCOUNTER — Encounter: Payer: Self-pay | Admitting: Cardiology

## 2024-02-26 ENCOUNTER — Ambulatory Visit (HOSPITAL_COMMUNITY)
Admission: RE | Admit: 2024-02-26 | Discharge: 2024-02-26 | Disposition: A | Discharge status: Home / Self Care | Source: Ambulatory Visit | Attending: Cardiovascular Disease | Admitting: Cardiovascular Disease

## 2024-02-26 VITALS — BP 128/77 | HR 73 | Wt 291.0 lb

## 2024-02-26 VITALS — BP 144/60 | HR 93 | Ht 66.0 in | Wt 294.0 lb

## 2024-02-26 DIAGNOSIS — N390 Urinary tract infection, site not specified: Secondary | ICD-10-CM

## 2024-02-26 DIAGNOSIS — R0789 Other chest pain: Secondary | ICD-10-CM | POA: Insufficient documentation

## 2024-02-26 DIAGNOSIS — Z79899 Other long term (current) drug therapy: Secondary | ICD-10-CM | POA: Diagnosis not present

## 2024-02-26 DIAGNOSIS — Z87891 Personal history of nicotine dependence: Secondary | ICD-10-CM | POA: Diagnosis not present

## 2024-02-26 DIAGNOSIS — Z3A37 37 weeks gestation of pregnancy: Secondary | ICD-10-CM | POA: Diagnosis not present

## 2024-02-26 DIAGNOSIS — O99413 Diseases of the circulatory system complicating pregnancy, third trimester: Secondary | ICD-10-CM | POA: Insufficient documentation

## 2024-02-26 DIAGNOSIS — I361 Nonrheumatic tricuspid (valve) insufficiency: Secondary | ICD-10-CM | POA: Insufficient documentation

## 2024-02-26 DIAGNOSIS — R0609 Other forms of dyspnea: Secondary | ICD-10-CM | POA: Insufficient documentation

## 2024-02-26 DIAGNOSIS — Z3A38 38 weeks gestation of pregnancy: Secondary | ICD-10-CM | POA: Diagnosis not present

## 2024-02-26 DIAGNOSIS — O0990 Supervision of high risk pregnancy, unspecified, unspecified trimester: Secondary | ICD-10-CM | POA: Diagnosis not present

## 2024-02-26 DIAGNOSIS — I517 Cardiomegaly: Secondary | ICD-10-CM | POA: Insufficient documentation

## 2024-02-26 DIAGNOSIS — O99820 Streptococcus B carrier state complicating pregnancy: Secondary | ICD-10-CM

## 2024-02-26 DIAGNOSIS — I071 Rheumatic tricuspid insufficiency: Secondary | ICD-10-CM | POA: Insufficient documentation

## 2024-02-26 DIAGNOSIS — O10113 Pre-existing hypertensive heart disease complicating pregnancy, third trimester: Secondary | ICD-10-CM | POA: Diagnosis not present

## 2024-02-26 DIAGNOSIS — O10919 Unspecified pre-existing hypertension complicating pregnancy, unspecified trimester: Secondary | ICD-10-CM | POA: Insufficient documentation

## 2024-02-26 DIAGNOSIS — R0600 Dyspnea, unspecified: Secondary | ICD-10-CM | POA: Diagnosis not present

## 2024-02-26 DIAGNOSIS — I119 Hypertensive heart disease without heart failure: Secondary | ICD-10-CM | POA: Diagnosis not present

## 2024-02-26 DIAGNOSIS — O99210 Obesity complicating pregnancy, unspecified trimester: Secondary | ICD-10-CM

## 2024-02-26 LAB — ECHOCARDIOGRAM COMPLETE
Area-P 1/2: 4.91 cm2
S' Lateral: 3.5 cm

## 2024-02-26 MED ORDER — LABETALOL HCL 200 MG PO TABS
200.0000 mg | ORAL_TABLET | Freq: Three times a day (TID) | ORAL | 2 refills | Status: DC
Start: 2024-02-26 — End: 2024-05-03

## 2024-02-26 NOTE — Telephone Encounter (Signed)
 Hypertension apt scheduled with me on May 30 at 10:30

## 2024-02-26 NOTE — Patient Instructions (Addendum)
 Medication Instructions:  Your physician has recommended you make the following change in your medication:  INCREASE: Labetalol  200 mg three times daily  *If you need a refill on your cardiac medications before your next appointment, please call your pharmacy*  Follow-Up: At [facility], you and your health needs are our priority.  As part of our continuing mission to provide you with exceptional heart care, our providers are all part of one team.  This team includes your primary Cardiologist (physician) and Advanced Practice Providers or APPs (Physician Assistants and Nurse Practitioners) who all work together to provide you with the care you need, when you need it.  Your next appointment:    June 10th at 9:40 am  Provider:   Kardie Tobb, DO     Other Instructions Please keep appt with Leonia Raman - if in labor please reach out to reschedule.   Please take your blood pressure daily for 1 week and send in a MyChart message. Please include heart rates. (One message at the end of the 1 week).   HOW TO TAKE YOUR BLOOD PRESSURE: Rest 5 minutes before taking your blood pressure. Don't smoke or drink caffeinated beverages for at least 30 minutes before. Take your blood pressure before (not after) you eat. Sit comfortably with your back supported and both feet on the floor (don't cross your legs). Elevate your arm to heart level on a table or a desk. Use the proper sized cuff. It should fit smoothly and snugly around your bare upper arm. There should be enough room to slip a fingertip under the cuff. The bottom edge of the cuff should be 1 inch above the crease of the elbow. Ideally, take 3 measurements at one sitting and record the average.

## 2024-02-26 NOTE — Progress Notes (Signed)
 Pt presents for ROB visit. No concerns

## 2024-02-26 NOTE — Progress Notes (Signed)
   PRENATAL VISIT NOTE  Subjective:  Anita Herrera is a 28 y.o. [phone] at [redacted]w[redacted]d being seen today for ongoing prenatal care.  She is currently monitored for the following issues for this high-risk pregnancy and has LGSIL of cervix of undetermined significance; History of shoulder dystocia in prior pregnancy-LGA; Supervision of high risk pregnancy, antepartum; Late prenatal care affecting pregnancy; Obesity affecting pregnancy, antepartum; Chronic hypertension affecting pregnancy; Abnormal fetal ultrasound, echogenic bowel; Asymptomatic bacteriuria during pregnancy; and GBS (group B Streptococcus carrier), +RV culture, currently pregnant on their problem list.  Patient reports no complaints.  Contractions: Irritability. Vag. Bleeding: None.  Movement: Present. Denies leaking of fluid.   The following portions of the patient's history were reviewed and updated as appropriate: allergies, current medications, past family history, past medical history, past social history, past surgical history and problem list.   Objective:    Vitals:   02/26/24 1352  BP: 128/77  Pulse: 73  Weight: 291 lb (132 kg)    Fetal Status:  Fetal Heart Rate (bpm): 128   Movement: Present    General: Alert, oriented and cooperative. Patient is in no acute distress.  Skin: Skin is warm and dry. No rash noted.   Cardiovascular: Normal heart rate noted  Respiratory: Normal respiratory effort, no problems with respiration noted  Abdomen: Soft, gravid, appropriate for gestational age.  Pain/Pressure: Absent     Pelvic: Cervical exam deferred        Extremities: Normal range of motion.  Edema: None  Mental Status: Normal mood and affect. Normal behavior. Normal judgment and thought content.   Assessment and Plan:  Pregnancy: [id number] at [redacted]w[redacted]d 1. Supervision of high risk pregnancy, antepartum (Primary) BP and FHR normal Doing well, feeling regular movement    2. [redacted] weeks gestation of pregnancy Precautions  provided when to follow up in [facility]   3. Recurrent UTI On keflex  daily  for suppression   4. Chronic hypertension affecting pregnancy Normotensive on labetalol  Continue antenatal testing IOL 39 weeks scheduled today  5/20 u/s EFW 91%, AC 99%, normal AFI BPP 8/8, cephalic presentation NST reactive  5. Obesity affecting pregnancy, antepartum, unspecified obesity type   6. GBS (group B Streptococcus carrier), +RV culture, currently pregnant Tx in labor   Term labor symptoms and general obstetric precautions including but not limited to vaginal bleeding, contractions, leaking of fluid and fetal movement were reviewed in detail with the patient. Please refer to After Visit Summary for other counseling recommendations.   Return in one week for routine prenatal    Susi Eric, FNP

## 2024-02-26 NOTE — Telephone Encounter (Signed)
-----   Message from Alvah Auerbach sent at 02/26/2024 11:08 AM EDT ----- Regarding: 1wk appt Pt needs a 1wk pharmd HTN appt--per Dr Emmette Harms.  Please call her at [phone] to schedule appt appt.  Thank you

## 2024-02-28 NOTE — Progress Notes (Signed)
 Cardio-Obstetrics Clinic  New Evaluation  Date:  02/28/2024   ID:  Anita Herrera, DOB 04-21-1996, MRN [id number]  PCP:  Patient, No Pcp Per   [facility] Providers Cardiologist:  Jerryl Morin, DO  Electrophysiologist:  None       Referring MD: No ref. provider found   Chief Complaint: " I am having high blood pressure"  History of Present Illness:    Anita Herrera is a 28 y.o. female [[id number]] who is here for a follow up visit.  She presents for management of hypertension during pregnancy. She is accompanied by her aunt, who acts as a maternal figure.  At her last visit no medication changes were made. I ordered an echo due to her atypical chest pain.   She is [redacted] weeks pregnant and has elevated home blood pressure readings, with a recent reading of 180 mmHg. Other readings include 147/76 mmHg and 146/82 mmHg. An echocardiogram today shows fluid around the heart and mild thickening of the heart wall. She experiences anxiety about her heart health during pregnancy. There are no symptoms of heart failure or significant changes in her overall health aside from blood pressure concerns.   Prior CV Studies Reviewed: The following studies were reviewed today:   Past Medical History:  Diagnosis Date   Anemia    Shingles    Vaginitis     Past Surgical History:  Procedure Laterality Date   WISDOM TOOTH EXTRACTION        OB History     Gravida  8   Para  3   Term  3   Preterm  0   AB  4   Living  3      SAB  1   IAB  3   Ectopic  0   Multiple  0   Live Births  3               Current Medications: Current Meds  Medication Sig   aspirin  EC 81 MG tablet Take 2 tablets (162 mg total) by mouth daily. Swallow whole.   cephALEXin  (KEFLEX ) 500 MG capsule Take 1 capsule (500 mg total) by mouth 2 (two) times daily.   labetalol  (NORMODYNE ) 200 MG tablet Take 1 tablet (200 mg total) by mouth 3 (three) times daily.   pantoprazole  (PROTONIX )  20 MG tablet Take 1 tablet (20 mg total) by mouth daily.   Prenatal 28-0.8 MG TABS Take 1 tablet by mouth daily.   [DISCONTINUED] labetalol  (NORMODYNE ) 100 MG tablet Take 2 tablets (200 mg total) by mouth 2 (two) times daily.     Allergies:   Patient has no known allergies.   Social History   Socioeconomic History   Marital status: Single    Spouse name: Not on file   Number of children: Not on file   Years of education: Not on file   Highest education level: Not on file  Occupational History   Not on file  Tobacco Use   Smoking status: Former    Current packs/day: 0.00    Types: Cigarettes    Quit date: 07/2023    Years since quitting: 0.6   Smokeless tobacco: Never  Vaping Use   Vaping status: Never Used  Substance and Sexual Activity   Alcohol use: Not Currently    Alcohol/week: 2.0 standard drinks of alcohol    Types: 2 Shots of liquor per week    Comment: Not since confirmed pregnancy   Drug use: No  Sexual activity: Yes  Other Topics Concern   Not on file  Social History Narrative         Social Drivers of Health   Financial Resource Strain: Not on file  Food Insecurity: No Food Insecurity (08/06/2022)   Hunger Vital Sign    Worried About Running Out of Food in the Last Year: Never true    Ran Out of Food in the Last Year: Never true  Transportation Needs: No Transportation Needs (08/06/2022)   PRAPARE - Administrator, Civil Service (Medical): No    Lack of Transportation (Non-Medical): No  Physical Activity: Not on file  Stress: Not on file  Social Connections: Not on file      Family History  Problem Relation Age of Onset   Diabetes Mother    Hypertension Maternal Grandmother    Asthma Neg Hx    Cancer Neg Hx    Birth defects Neg Hx    Heart disease Neg Hx    Stroke Neg Hx       ROS:   Please see the history of present illness.     All other systems reviewed and are negative.   Labs/EKG Reviewed:    EKG:   EKG was not   ordered today.    Recent Labs: 01/17/2024: ALT 46; B Natriuretic Peptide 15.0; BUN 9; Creatinine, Ser 0.46; Hemoglobin 10.3; Platelets 220; Potassium 4.1; Sodium 135   Recent Lipid Panel No results found for: "CHOL", "TRIG", "HDL", "CHOLHDL", "LDLCALC", "LDLDIRECT"  Physical Exam:    VS:  BP (!) 144/60 (BP Location: Right Arm, Patient Position: Sitting, Cuff Size: Normal)   Pulse 93   Ht 5\' 6"  (1.676 m)   Wt 294 lb (133.4 kg)   LMP 06/06/2023   SpO2 97%   BMI 47.45 kg/m     Wt Readings from Last 3 Encounters:  02/26/24 291 lb (132 kg)  02/26/24 294 lb (133.4 kg)  02/20/24 296 lb (134.3 kg)     GEN:  Well nourished, well developed in no acute distress HEENT: Normal NECK: No JVD; No carotid bruits LYMPHATICS: No lymphadenopathy CARDIAC: RRR, no murmurs, rubs, gallops RESPIRATORY:  Clear to auscultation without rales, wheezing or rhonchi  ABDOMEN: Soft, non-tender, non-distended MUSCULOSKELETAL:  No edema; No deformity  SKIN: Warm and dry NEUROLOGIC:  Alert and oriented x 3 PSYCHIATRIC:  Normal affect    Risk Assessment/Risk Calculators:                  ASSESSMENT & PLAN:    Chronic Hypertension Chronic hypertension with elevated home readings, currently on labetalol . Discussed importance of blood pressure control, especially during pregnancy. Plan to wean off medication postpartum, targeting <120/80 mmHg. - Increase labetalol  to three times daily. - Ensure properly fitting blood pressure cuff. - Schedule follow-up with cardio OB pharmacist in one week. - Plan postpartum virtual visit to assess blood pressure control.  Left Ventricular Hypertrophy Mild LVH on echocardiogram, likely due to chronic hypertension. No heart failure or long-term complications. Discussed importance of blood pressure control to prevent progression. - Continue monitoring with echocardiograms as needed. - Maintain blood pressure control with adjusted medication regimen.  Mild Tricuspid  Regurgitation Mild tricuspid regurgitation on echocardiogram, common in pregnancy, without symptoms or complications.    Patient Instructions  Medication Instructions:  Your physician has recommended you make the following change in your medication:  INCREASE: Labetalol  200 mg three times daily  *If you need a refill on your cardiac medications  before your next appointment, please call your pharmacy*  Follow-Up: At [facility], you and your health needs are our priority.  As part of our continuing mission to provide you with exceptional heart care, our providers are all part of one team.  This team includes your primary Cardiologist (physician) and Advanced Practice Providers or APPs (Physician Assistants and Nurse Practitioners) who all work together to provide you with the care you need, when you need it.  Your next appointment:    June 10th at 9:40 am  Provider:   Annalise Mcdiarmid, DO     Other Instructions Please keep appt with Leonia Raman - if in labor please reach out to reschedule.   Please take your blood pressure daily for 1 week and send in a MyChart message. Please include heart rates. (One message at the end of the 1 week).   HOW TO TAKE YOUR BLOOD PRESSURE: Rest 5 minutes before taking your blood pressure. Don't smoke or drink caffeinated beverages for at least 30 minutes before. Take your blood pressure before (not after) you eat. Sit comfortably with your back supported and both feet on the floor (don't cross your legs). Elevate your arm to heart level on a table or a desk. Use the proper sized cuff. It should fit smoothly and snugly around your bare upper arm. There should be enough room to slip a fingertip under the cuff. The bottom edge of the cuff should be 1 inch above the crease of the elbow. Ideally, take 3 measurements at one sitting and record the average.    Dispo:  No follow-ups on file.   Medication Adjustments/Labs and Tests Ordered: Current  medicines are reviewed at length with the patient today.  Concerns regarding medicines are outlined above.  Tests Ordered: Orders Placed This Encounter  Procedures   AMB Referral to [facility] Pharm-D   Medication Changes: Meds ordered this encounter  Medications   labetalol  (NORMODYNE ) 200 MG tablet    Sig: Take 1 tablet (200 mg total) by mouth 3 (three) times daily.    Dispense:  90 tablet    Refill:  2    Dose increase - discontinue prescription for labetalol  twice daily

## 2024-03-04 ENCOUNTER — Telehealth (HOSPITAL_COMMUNITY): Payer: Self-pay | Admitting: *Deleted

## 2024-03-04 ENCOUNTER — Encounter (HOSPITAL_COMMUNITY): Payer: Self-pay | Admitting: *Deleted

## 2024-03-04 ENCOUNTER — Ambulatory Visit: Admitting: Obstetrics and Gynecology

## 2024-03-04 ENCOUNTER — Encounter: Admitting: Obstetrics and Gynecology

## 2024-03-04 VITALS — BP 129/82 | HR 76 | Wt 289.0 lb

## 2024-03-04 DIAGNOSIS — O10913 Unspecified pre-existing hypertension complicating pregnancy, third trimester: Secondary | ICD-10-CM | POA: Diagnosis not present

## 2024-03-04 DIAGNOSIS — R87612 Low grade squamous intraepithelial lesion on cytologic smear of cervix (LGSIL): Secondary | ICD-10-CM | POA: Diagnosis not present

## 2024-03-04 DIAGNOSIS — O0990 Supervision of high risk pregnancy, unspecified, unspecified trimester: Secondary | ICD-10-CM | POA: Diagnosis not present

## 2024-03-04 DIAGNOSIS — Z6841 Body Mass Index (BMI) 40.0 and over, adult: Secondary | ICD-10-CM

## 2024-03-04 DIAGNOSIS — O10919 Unspecified pre-existing hypertension complicating pregnancy, unspecified trimester: Secondary | ICD-10-CM

## 2024-03-04 DIAGNOSIS — Z3A38 38 weeks gestation of pregnancy: Secondary | ICD-10-CM

## 2024-03-04 DIAGNOSIS — O99820 Streptococcus B carrier state complicating pregnancy: Secondary | ICD-10-CM

## 2024-03-04 DIAGNOSIS — O3663X0 Maternal care for excessive fetal growth, third trimester, not applicable or unspecified: Secondary | ICD-10-CM

## 2024-03-04 DIAGNOSIS — Z8759 Personal history of other complications of pregnancy, childbirth and the puerperium: Secondary | ICD-10-CM

## 2024-03-04 DIAGNOSIS — O283 Abnormal ultrasonic finding on antenatal screening of mother: Secondary | ICD-10-CM

## 2024-03-04 NOTE — Progress Notes (Signed)
ROB NST 

## 2024-03-04 NOTE — Progress Notes (Signed)
   PRENATAL VISIT NOTE  Subjective:  Anita Herrera is a 28 y.o. [phone] at [redacted]w[redacted]d being seen today for ongoing prenatal care.  She is currently monitored for the following issues for this high-risk pregnancy and has LGSIL of cervix of undetermined significance; History of shoulder dystocia in prior pregnancy-LGA; Supervision of high risk pregnancy, antepartum; Late prenatal care affecting pregnancy; Obesity affecting pregnancy, antepartum; Chronic hypertension affecting pregnancy; Abnormal fetal ultrasound, echogenic bowel; Asymptomatic bacteriuria during pregnancy; and GBS (group B Streptococcus carrier), +RV culture, currently pregnant on their problem list.  Patient reports decreased fetal movement x 2 days. Still feeling movement just not as much as usual.  Contractions: Irritability. Vag. Bleeding: None.  Movement: (!) Decreased. Denies leaking of fluid.   The following portions of the patient's history were reviewed and updated as appropriate: allergies, current medications, past family history, past medical history, past social history, past surgical history and problem list.   Objective:   Vitals:   03/04/24 1338  BP: 129/82  Pulse: 76  Weight: 289 lb (131.1 kg)   Fetal Status: Fetal Heart Rate (bpm): NST   Movement: (!) Decreased     General:  Alert, oriented and cooperative. Patient is in no acute distress.  Skin: Skin is warm and dry. No rash noted.   Cardiovascular: Normal heart rate noted  Respiratory: Normal respiratory effort, no problems with respiration noted  Abdomen: Soft, gravid, appropriate for gestational age.  Pain/Pressure: Absent      Assessment and Plan:  Pregnancy: [name] at [redacted]w[redacted]d 1. Supervision of high risk pregnancy, antepartum (Primary) 2. [redacted] weeks gestation of pregnancy Keflex  for UTI suppression NST today reactive and reassuring - 130bpm, moderate variability, +accels, no decels. Pt feeling frequent fetal movement while on the monitor Discussed  fetal kick counts and low threshold to present to MAU for any further DFM prior to her induction  3. LGSIL of cervix of undetermined significance NILM pap 10/23/23 - repeat 2026 w/ HPV testing  6. Chronic hypertension affecting pregnancy 4. BMI 45.0-49.9, adult (HCC) BP well controlled today Continue labetalol  200mg  TID, ldASA Growth US  below - LGA Echo 5/22 w/ mild LVH and tricuspid regurgitation. F/w Dr. Emmette Harms IOL scheduled 5/31  5. History of shoulder dystocia in prior pregnancy 9. LGA fetus G2 (2016) - <30s, resolved with McRoberts and suprapubic pressure. Infant weight 9lb5oz (4230g) Pelvis otherwise proven to 8lb 11oz (3941g) Growth 5/20 @[redacted]w[redacted]d  3659g (91%), AC >99%, normal AFI Discussed risk of recurrence of shoulder dystocia with risk of neonatal injury including fracture, brachial plexus injury, brain injury or death. Reviewed these are RARE complications and that most women will not have SD recurrence or if they do, it will be brief without permanent injury to infant. Reviewed that unfortunately, shoulder dystocia cannot be prevented with earlier IOL nor can it be predicted.   7. GBS (group B Streptococcus carrier), +RV culture, currently pregnant PCN in labor  8. Abnormal fetal ultrasound, echogenic bowel Resolved 5/20  Please refer to After Visit Summary for other counseling recommendations.   Future Appointments  Date Time Provider Department Center  03/05/2024 10:30 AM Cathalene Clipper, [facility] H&V  03/06/2024  7:00 AM [facility] SCHED ROOM [facility] None  03/16/2024  9:40 AM Tobb, Kardie, DO [facility] H&V   Izell Marsh, MD

## 2024-03-04 NOTE — Telephone Encounter (Signed)
 Preadmission screen

## 2024-03-04 NOTE — Addendum Note (Signed)
 Addended by: Ulas Zuercher J on: 03/04/2024 02:22 PM   Modules accepted: Orders

## 2024-03-05 ENCOUNTER — Inpatient Hospital Stay (HOSPITAL_COMMUNITY): Admitting: Anesthesiology

## 2024-03-05 ENCOUNTER — Encounter (HOSPITAL_COMMUNITY): Payer: Self-pay | Admitting: Obstetrics and Gynecology

## 2024-03-05 ENCOUNTER — Other Ambulatory Visit: Payer: Self-pay

## 2024-03-05 ENCOUNTER — Inpatient Hospital Stay (HOSPITAL_COMMUNITY)
Admission: AD | Admit: 2024-03-05 | Discharge: 2024-03-06 | DRG: 807 | Disposition: A | Discharge status: Home / Self Care | Attending: Obstetrics and Gynecology | Admitting: Obstetrics and Gynecology

## 2024-03-05 ENCOUNTER — Ambulatory Visit: Admitting: Pharmacist

## 2024-03-05 DIAGNOSIS — Z3A38 38 weeks gestation of pregnancy: Secondary | ICD-10-CM

## 2024-03-05 DIAGNOSIS — O10919 Unspecified pre-existing hypertension complicating pregnancy, unspecified trimester: Secondary | ICD-10-CM | POA: Diagnosis present

## 2024-03-05 DIAGNOSIS — O99214 Obesity complicating childbirth: Secondary | ICD-10-CM | POA: Diagnosis present

## 2024-03-05 DIAGNOSIS — O1092 Unspecified pre-existing hypertension complicating childbirth: Principal | ICD-10-CM | POA: Diagnosis present

## 2024-03-05 DIAGNOSIS — E66813 Obesity, class 3: Secondary | ICD-10-CM | POA: Diagnosis present

## 2024-03-05 DIAGNOSIS — O9902 Anemia complicating childbirth: Secondary | ICD-10-CM | POA: Diagnosis present

## 2024-03-05 DIAGNOSIS — Z8249 Family history of ischemic heart disease and other diseases of the circulatory system: Secondary | ICD-10-CM

## 2024-03-05 DIAGNOSIS — Z833 Family history of diabetes mellitus: Secondary | ICD-10-CM | POA: Diagnosis not present

## 2024-03-05 DIAGNOSIS — O4292 Full-term premature rupture of membranes, unspecified as to length of time between rupture and onset of labor: Principal | ICD-10-CM

## 2024-03-05 DIAGNOSIS — O99820 Streptococcus B carrier state complicating pregnancy: Secondary | ICD-10-CM

## 2024-03-05 DIAGNOSIS — O99824 Streptococcus B carrier state complicating childbirth: Secondary | ICD-10-CM | POA: Diagnosis present

## 2024-03-05 DIAGNOSIS — Z7982 Long term (current) use of aspirin: Secondary | ICD-10-CM

## 2024-03-05 DIAGNOSIS — Z87891 Personal history of nicotine dependence: Secondary | ICD-10-CM

## 2024-03-05 DIAGNOSIS — O0990 Supervision of high risk pregnancy, unspecified, unspecified trimester: Secondary | ICD-10-CM

## 2024-03-05 DIAGNOSIS — O1002 Pre-existing essential hypertension complicating childbirth: Secondary | ICD-10-CM

## 2024-03-05 DIAGNOSIS — O4202 Full-term premature rupture of membranes, onset of labor within 24 hours of rupture: Secondary | ICD-10-CM

## 2024-03-05 HISTORY — DX: Essential (primary) hypertension: I10

## 2024-03-05 LAB — COMPREHENSIVE METABOLIC PANEL WITH GFR
ALT: 47 U/L — ABNORMAL HIGH (ref 0–44)
AST: 32 U/L (ref 15–41)
Albumin: 2.7 g/dL — ABNORMAL LOW (ref 3.5–5.0)
Alkaline Phosphatase: 114 U/L (ref 38–126)
Anion gap: 10 (ref 5–15)
BUN: 6 mg/dL (ref 6–20)
CO2: 18 mmol/L — ABNORMAL LOW (ref 22–32)
Calcium: 9.4 mg/dL (ref 8.9–10.3)
Chloride: 107 mmol/L (ref 98–111)
Creatinine, Ser: 0.45 mg/dL (ref 0.44–1.00)
GFR, Estimated: >60 mL/min (ref >60)
Glucose, Bld: 92 mg/dL (ref 70–99)
Potassium: 4.1 mmol/L (ref 3.5–5.1)
Sodium: 135 mmol/L (ref 135–145)
Total Bilirubin: 0.3 mg/dL (ref 0.0–1.2)
Total Protein: 6.7 g/dL (ref 6.5–8.1)

## 2024-03-05 LAB — CBC
HCT: 34 % — ABNORMAL LOW (ref 36.0–46.0)
HCT: 33.5 % — ABNORMAL LOW (ref 36.0–46.0)
Hemoglobin: 11.1 g/dL — ABNORMAL LOW (ref 12.0–15.0)
Hemoglobin: 11 g/dL — ABNORMAL LOW (ref 12.0–15.0)
MCH: 29.5 pg (ref 26.0–34.0)
MCH: 29.8 pg (ref 26.0–34.0)
MCHC: 32.6 g/dL (ref 30.0–36.0)
MCHC: 32.8 g/dL (ref 30.0–36.0)
MCV: 90.4 fL (ref 80.0–100.0)
MCV: 90.8 fL (ref 80.0–100.0)
Platelets: 226 10*3/uL (ref 150–400)
Platelets: 227 10*3/uL (ref 150–400)
RBC: 3.76 MIL/uL — ABNORMAL LOW (ref 3.87–5.11)
RBC: 3.69 MIL/uL — ABNORMAL LOW (ref 3.87–5.11)
RDW: 14.3 % (ref 11.5–15.5)
RDW: 14.3 % (ref 11.5–15.5)
WBC: 8.3 10*3/uL (ref 4.0–10.5)
WBC: 9.3 10*3/uL (ref 4.0–10.5)
nRBC: 0 % (ref 0.0–0.2)
nRBC: 0 % (ref 0.0–0.2)

## 2024-03-05 LAB — URINALYSIS, ROUTINE W REFLEX MICROSCOPIC
Bilirubin Urine: NEGATIVE
Glucose, UA: NEGATIVE mg/dL
Ketones, ur: NEGATIVE mg/dL
Nitrite: NEGATIVE
Protein, ur: 100 mg/dL — AB
RBC / HPF: >50 RBC/hpf (ref 0–5)
Specific Gravity, Urine: 1.018 (ref 1.005–1.030)
pH: 6 (ref 5.0–8.0)

## 2024-03-05 LAB — POCT FERN TEST: POCT Fern Test: POSITIVE

## 2024-03-05 LAB — TYPE AND SCREEN
ABO/RH(D): O POS
Antibody Screen: NEGATIVE

## 2024-03-05 LAB — PROTEIN / CREATININE RATIO, URINE
Creatinine, Urine: 111 mg/dL
Protein Creatinine Ratio: 0.25 mg/mg{creat} — ABNORMAL HIGH (ref 0.00–0.15)
Total Protein, Urine: 28 mg/dL

## 2024-03-05 LAB — RPR: RPR Ser Ql: NONREACTIVE

## 2024-03-05 MED ORDER — OXYTOCIN BOLUS FROM INFUSION: 333.0000 mL | Freq: Once | INTRAVENOUS | Status: DC

## 2024-03-05 MED ORDER — ACETAMINOPHEN 325 MG PO TABS
650.0000 mg | ORAL_TABLET | ORAL | Status: DC | PRN
  Administered 2024-03-06 (×3): 650 mg via ORAL
  Filled 2024-03-05 (×3): qty 2

## 2024-03-05 MED ORDER — FENTANYL CITRATE (PF) 100 MCG/2ML IJ SOLN: 50.0000 ug | INTRAMUSCULAR | Status: DC | PRN

## 2024-03-05 MED ORDER — PENICILLIN G POT IN DEXTROSE 60000 UNIT/ML IV SOLN
3.0000 10*6.[IU] | INTRAVENOUS | Status: DC
  Administered 2024-03-05: 3 10*6.[IU] via INTRAVENOUS
  Filled 2024-03-05: qty 50

## 2024-03-05 MED ORDER — OXYTOCIN-SODIUM CHLORIDE 30-0.9 UT/500ML-% IV SOLN
2.5000 [IU]/h | INTRAVENOUS | Status: DC
Start: 2024-03-05 — End: 2024-03-05
  Administered 2024-03-05: 2.5 [IU]/h via INTRAVENOUS
  Filled 2024-03-05: qty 500

## 2024-03-05 MED ORDER — WITCH HAZEL-GLYCERIN EX PADS
1.0000 | MEDICATED_PAD | CUTANEOUS | Status: DC | PRN
Start: 2024-03-05 — End: 2024-03-07

## 2024-03-05 MED ORDER — ONDANSETRON HCL 4 MG/2ML IJ SOLN: 4.0000 mg | Freq: Four times a day (QID) | INTRAMUSCULAR | Status: DC | PRN

## 2024-03-05 MED ORDER — DIBUCAINE (PERIANAL) 1 % EX OINT
1.0000 | TOPICAL_OINTMENT | CUTANEOUS | Status: DC | PRN
Start: 2024-03-05 — End: 2024-03-07

## 2024-03-05 MED ORDER — SOD CITRATE-CITRIC ACID 500-334 MG/5ML PO SOLN: 30.0000 mL | ORAL | Status: DC | PRN

## 2024-03-05 MED ORDER — DIPHENHYDRAMINE HCL 25 MG PO CAPS
25.0000 mg | ORAL_CAPSULE | Freq: Four times a day (QID) | ORAL | Status: DC | PRN
Start: 2024-03-05 — End: 2024-03-07

## 2024-03-05 MED ORDER — DIPHENHYDRAMINE HCL 50 MG/ML IJ SOLN: 12.5000 mg | INTRAMUSCULAR | Status: DC | PRN

## 2024-03-05 MED ORDER — SODIUM CHLORIDE 0.9% FLUSH
3.0000 mL | Freq: Two times a day (BID) | INTRAVENOUS | Status: DC
  Administered 2024-03-05: 3 mL via INTRAVENOUS

## 2024-03-05 MED ORDER — COCONUT OIL OIL: 1.0000 | TOPICAL_OIL | Status: DC | PRN

## 2024-03-05 MED ORDER — SODIUM CHLORIDE 0.9 % IV SOLN: 5.0000 10*6.[IU] | Freq: Once | INTRAVENOUS | Status: DC

## 2024-03-05 MED ORDER — PRENATAL MULTIVITAMIN CH
1.0000 | ORAL_TABLET | Freq: Every day | ORAL | Status: DC
  Administered 2024-03-06: 1 via ORAL
  Filled 2024-03-05: qty 1

## 2024-03-05 MED ORDER — LACTATED RINGERS IV SOLN: 500.0000 mL | INTRAVENOUS | Status: DC | PRN

## 2024-03-05 MED ORDER — BENZOCAINE-MENTHOL 20-0.5 % EX AERO
1.0000 | INHALATION_SPRAY | CUTANEOUS | Status: DC | PRN
  Administered 2024-03-06: 1 via TOPICAL
  Filled 2024-03-05: qty 56

## 2024-03-05 MED ORDER — LACTATED RINGERS IV SOLN: INTRAVENOUS | Status: DC

## 2024-03-05 MED ORDER — OXYTOCIN-SODIUM CHLORIDE 30-0.9 UT/500ML-% IV SOLN: 2.5000 [IU]/h | INTRAVENOUS | Status: DC

## 2024-03-05 MED ORDER — ONDANSETRON HCL 4 MG PO TABS: 4.0000 mg | ORAL_TABLET | ORAL | Status: DC | PRN

## 2024-03-05 MED ORDER — OXYTOCIN BOLUS FROM INFUSION
333.0000 mL | Freq: Once | INTRAVENOUS | Status: AC
  Administered 2024-03-05: 333 mL via INTRAVENOUS

## 2024-03-05 MED ORDER — FUROSEMIDE 20 MG PO TABS
20.0000 mg | ORAL_TABLET | Freq: Every day | ORAL | Status: DC
  Administered 2024-03-06: 20 mg via ORAL
  Filled 2024-03-05: qty 1

## 2024-03-05 MED ORDER — TERBUTALINE SULFATE 1 MG/ML IJ SOLN: 0.2500 mg | Freq: Once | INTRAMUSCULAR | Status: DC | PRN

## 2024-03-05 MED ORDER — LIDOCAINE HCL (PF) 1 % IJ SOLN
INTRAMUSCULAR | Status: DC | PRN
Start: 2024-03-05 — End: 2024-03-05
  Administered 2024-03-05: 5 mL via EPIDURAL
  Administered 2024-03-05: 3 mL via EPIDURAL
  Administered 2024-03-05: 2 mL via EPIDURAL

## 2024-03-05 MED ORDER — OXYCODONE-ACETAMINOPHEN 5-325 MG PO TABS: 2.0000 | ORAL_TABLET | ORAL | Status: DC | PRN

## 2024-03-05 MED ORDER — POTASSIUM CHLORIDE CRYS ER 20 MEQ PO TBCR
20.0000 meq | EXTENDED_RELEASE_TABLET | Freq: Every day | ORAL | Status: DC
  Administered 2024-03-06: 20 meq via ORAL
  Filled 2024-03-05: qty 1

## 2024-03-05 MED ORDER — LABETALOL HCL 200 MG PO TABS
200.0000 mg | ORAL_TABLET | Freq: Three times a day (TID) | ORAL | Status: DC
  Administered 2024-03-05 – 2024-03-06 (×4): 200 mg via ORAL
  Filled 2024-03-05 (×4): qty 1

## 2024-03-05 MED ORDER — PHENYLEPHRINE 80 MCG/ML (10ML) SYRINGE FOR IV PUSH (FOR BLOOD PRESSURE SUPPORT)
80.0000 ug | PREFILLED_SYRINGE | INTRAVENOUS | Status: DC | PRN
Start: 2024-03-05 — End: 2024-03-05

## 2024-03-05 MED ORDER — ZOLPIDEM TARTRATE 5 MG PO TABS
5.0000 mg | ORAL_TABLET | Freq: Every evening | ORAL | Status: DC | PRN
Start: 2024-03-05 — End: 2024-03-07

## 2024-03-05 MED ORDER — EPHEDRINE 5 MG/ML INJ: 10.0000 mg | INTRAVENOUS | Status: DC | PRN

## 2024-03-05 MED ORDER — SODIUM CHLORIDE 0.9 % IV SOLN
5.0000 10*6.[IU] | Freq: Once | INTRAVENOUS | Status: AC
  Administered 2024-03-05: 5 10*6.[IU] via INTRAVENOUS
  Filled 2024-03-05: qty 5

## 2024-03-05 MED ORDER — SODIUM CHLORIDE 0.9 % IV SOLN: 250.0000 mL | INTRAVENOUS | Status: DC | PRN

## 2024-03-05 MED ORDER — ACETAMINOPHEN 325 MG PO TABS: 650.0000 mg | ORAL_TABLET | ORAL | Status: DC | PRN

## 2024-03-05 MED ORDER — LIDOCAINE HCL (PF) 1 % IJ SOLN: 30.0000 mL | INTRAMUSCULAR | Status: DC | PRN

## 2024-03-05 MED ORDER — OXYCODONE-ACETAMINOPHEN 5-325 MG PO TABS: 1.0000 | ORAL_TABLET | ORAL | Status: DC | PRN

## 2024-03-05 MED ORDER — SENNOSIDES-DOCUSATE SODIUM 8.6-50 MG PO TABS
2.0000 | ORAL_TABLET | ORAL | Status: DC
  Administered 2024-03-06: 2 via ORAL
  Filled 2024-03-05: qty 2

## 2024-03-05 MED ORDER — LACTATED RINGERS IV SOLN
500.0000 mL | Freq: Once | INTRAVENOUS | Status: AC
  Administered 2024-03-05: 500 mL via INTRAVENOUS

## 2024-03-05 MED ORDER — OXYTOCIN-SODIUM CHLORIDE 30-0.9 UT/500ML-% IV SOLN
1.0000 m[IU]/min | INTRAVENOUS | Status: DC
  Administered 2024-03-05: 2 m[IU]/min via INTRAVENOUS

## 2024-03-05 MED ORDER — ONDANSETRON HCL 4 MG/2ML IJ SOLN: 4.0000 mg | INTRAMUSCULAR | Status: DC | PRN

## 2024-03-05 MED ORDER — PHENYLEPHRINE 80 MCG/ML (10ML) SYRINGE FOR IV PUSH (FOR BLOOD PRESSURE SUPPORT): 80.0000 ug | PREFILLED_SYRINGE | INTRAVENOUS | Status: DC | PRN

## 2024-03-05 MED ORDER — ACETAMINOPHEN 325 MG PO TABS
650.0000 mg | ORAL_TABLET | ORAL | Status: DC | PRN
Start: 2024-03-05 — End: 2024-03-05
  Administered 2024-03-05: 650 mg via ORAL
  Filled 2024-03-05: qty 2

## 2024-03-05 MED ORDER — SODIUM CHLORIDE 0.9% FLUSH: 3.0000 mL | INTRAVENOUS | Status: DC | PRN

## 2024-03-05 MED ORDER — EPHEDRINE 5 MG/ML INJ
10.0000 mg | INTRAVENOUS | Status: DC | PRN
Start: 2024-03-05 — End: 2024-03-05

## 2024-03-05 MED ORDER — SIMETHICONE 80 MG PO CHEW: 80.0000 mg | CHEWABLE_TABLET | ORAL | Status: DC | PRN

## 2024-03-05 MED ORDER — LIDOCAINE HCL (PF) 1 % IJ SOLN
30.0000 mL | INTRAMUSCULAR | Status: DC | PRN
Start: 2024-03-05 — End: 2024-03-05

## 2024-03-05 MED ORDER — LACTATED RINGERS IV SOLN: 500.0000 mL | Freq: Once | INTRAVENOUS | Status: DC

## 2024-03-05 MED ORDER — FENTANYL-BUPIVACAINE-NACL 0.5-0.125-0.9 MG/250ML-% EP SOLN
12.0000 mL/h | EPIDURAL | Status: DC | PRN
  Administered 2024-03-05: 12 mL/h via EPIDURAL
  Filled 2024-03-05: qty 250

## 2024-03-05 MED ORDER — IBUPROFEN 600 MG PO TABS
600.0000 mg | ORAL_TABLET | Freq: Four times a day (QID) | ORAL | Status: DC
  Administered 2024-03-05 – 2024-03-06 (×4): 600 mg via ORAL
  Filled 2024-03-05 (×4): qty 1

## 2024-03-05 MED ORDER — PENICILLIN G POT IN DEXTROSE 60000 UNIT/ML IV SOLN: 3.0000 10*6.[IU] | INTRAVENOUS | Status: DC

## 2024-03-05 NOTE — Discharge Summary (Signed)
 Postpartum Discharge Summary  Date of Service updated     Patient Name: Anita Herrera DOB: Feb 22, 1996 MRN: [id number]  Date of admission: 03/05/2024 Delivery date:03/05/2024 Delivering provider: Melanie Spires Date of discharge: 03/06/2024  Admitting diagnosis: Normal labor [O80, Z37.9] Chronic hypertension affecting pregnancy [O10.919] Intrauterine pregnancy: [redacted]w[redacted]d     Secondary diagnosis:  Principal Problem:   SVD (spontaneous vaginal delivery) Active Problems:   Chronic hypertension affecting pregnancy   Normal labor  Additional problems: None    Discharge diagnosis: Term Pregnancy Delivered and CHTN                                              Post partum procedures:None Augmentation: Pitocin , AROM Complications: None  Hospital course: Onset of Labor With Vaginal Delivery      28 y.o. yo [id number] at [redacted]w[redacted]d was admitted for SROM on 03/05/2024. Labor course was complicated by recurrent deep variable decels around time of delivery, pt progressed quickly and delivered vigorous infant.  Membrane Rupture Time/Date: 8:00 AM,03/05/2024  Delivery Method:Vaginal, Spontaneous Operative Delivery:N/A Episiotomy: None Lacerations:  2nd degree;Labial Patient had a postpartum course complicated by continued elevated Bps. Controlled with Procardia  and started on lasix /K x5 days postpartum.  She is ambulating, tolerating a regular diet, passing flatus, and urinating well. Patient is discharged home in stable condition on 03/06/24.  Newborn Data: Birth date:03/05/2024 Birth time:7:36 PM Gender:Female Living status:Living Apgars:8 ,9  Weight:3980 g  Magnesium  Sulfate received: No BMZ received: No Rhophylac:N/A MMR:No T-DaP:Given prenatally Flu: No RSV Vaccine received: No Transfusion:No  Immunizations received: Immunization History  Administered Date(s) Administered   Tdap 04/20/2014    Physical exam  Vitals:   03/05/24 2200 03/05/24 2333 03/06/24 0311 03/06/24 1122   BP: 134/76 135/76 135/84 122/61  Pulse: 88 89 77 68  Resp: 18 18 18 18   Temp: 98.5 F (36.9 C) 98 F (36.7 C) 98.2 F (36.8 C) 98 F (36.7 C)  TempSrc:  Oral Oral Oral  SpO2: 99% 99% 99% 98%  Weight:      Height:       General: alert, cooperative, and no distress Lochia: appropriate Uterine Fundus: firm Incision: N/A DVT Evaluation: No evidence of DVT seen on physical exam. Labs: Lab Results  Component Value Date   WBC 10.1 03/06/2024   HGB 10.0 (L) 03/06/2024   HCT 31.0 (L) 03/06/2024   MCV 91.2 03/06/2024   PLT 212 03/06/2024      Latest Ref Rng & Units 03/05/2024   10:40 AM  CMP  Glucose 70 - 99 mg/dL 92   BUN 6 - 20 mg/dL 6   Creatinine 1.47 - 8.29 mg/dL 5.62   Sodium 130 - 865 mmol/L 135   Potassium 3.5 - 5.1 mmol/L 4.1   Chloride 98 - 111 mmol/L 107   CO2 22 - 32 mmol/L 18   Calcium 8.9 - 10.3 mg/dL 9.4   Total Protein 6.5 - 8.1 g/dL 6.7   Total Bilirubin 0.0 - 1.2 mg/dL 0.3   Alkaline Phos 38 - 126 U/L 114   AST 15 - 41 U/L 32   ALT 0 - 44 U/L 47    Edinburgh Score:    03/06/2024    4:13 PM  Edinburgh Postnatal Depression Scale Screening Tool  I have been able to laugh and see the funny side of things. 0  I  have looked forward with enjoyment to things. 0  I have blamed myself unnecessarily when things went wrong. 0  I have been anxious or worried for no good reason. 0  I have felt scared or panicky for no good reason. 0  Things have been getting on top of me. 0  I have been so unhappy that I have had difficulty sleeping. 0  I have felt sad or miserable. 0  I have been so unhappy that I have been crying. 0  The thought of harming myself has occurred to me. 0  Edinburgh Postnatal Depression Scale Total 0   Edinburgh Postnatal Depression Scale Total: 0   After visit meds:  Allergies as of 03/06/2024   No Known Allergies      Medication List     STOP taking these medications    aspirin  EC 81 MG tablet   cephALEXin  500 MG  capsule Commonly known as: KEFLEX        TAKE these medications    furosemide  20 MG tablet Commonly known as: LASIX  Take 1 tablet (20 mg total) by mouth daily. Start taking on: March 07, 2024   ibuprofen  600 MG tablet Commonly known as: ADVIL  Take 1 tablet (600 mg total) by mouth every 6 (six) hours.   labetalol  200 MG tablet Commonly known as: NORMODYNE  Take 1 tablet (200 mg total) by mouth 3 (three) times daily.   pantoprazole  20 MG tablet Commonly known as: PROTONIX  Take 1 tablet (20 mg total) by mouth daily.   potassium chloride  SA 20 MEQ tablet Commonly known as: KLOR-CON  M Take 1 tablet (20 mEq total) by mouth daily. Start taking on: March 07, 2024   Prenatal 28-0.8 MG Tabs Take 1 tablet by mouth daily.   senna-docusate 8.6-50 MG tablet Commonly known as: Senokot-S Take 2 tablets by mouth daily. Start taking on: March 07, 2024         Discharge home in stable condition Infant Feeding: Breast Infant Disposition:home with mother Discharge instruction: per After Visit Summary and Postpartum booklet. Activity: Advance as tolerated. Pelvic rest for 6 weeks.  Diet: routine diet Future Appointments: Future Appointments  Date Time Provider Department Center  03/16/2024  9:40 AM Tobb, Kardie, DO [facility] H&V   Follow up Visit:  Message sent to [facility] 5/30 Please schedule this patient for a In person postpartum visit in 6 weeks with the following provider: Any provider. Additional Postpartum F/U:BP check 1 week  High risk pregnancy complicated by: HTN Delivery mode:  Vaginal, Spontaneous Anticipated Birth Control:  IUD   03/06/2024 Maud Sorenson, MD

## 2024-03-05 NOTE — MAU Note (Signed)
 Anita Herrera is a 28 y.o. at [redacted]w[redacted]d here in MAU reporting:   Pt reports that at 8am this morning she saw pink Bevin Bucks on her panty liner and is unsure if her water broke.  Pt states she feels some pressure in her lower abdomen  2/10 pain  but unsure if she is having ctxs.   +fm.  Vitals:   03/05/24 0921 03/05/24 0932  BP:  (!) 121/54  Pulse:  78  Resp:  17  Temp: (!) 97.4 F (36.3 C)   SpO2:  96%     FHT: 145 Lab orders placed from triage: fern slide.

## 2024-03-05 NOTE — MAU Note (Signed)

## 2024-03-05 NOTE — MAU Provider Note (Signed)
 S: [name] is a 28 y.o. [id number] at [redacted]w[redacted]d  who presents to MAU today complaining of leaking of fluid since Yesterday 5/29. She denies vaginal bleeding. She endorses contractions. She reports normal fetal movement.    O: BP (!) 121/54 (BP Location: Right Arm)   Pulse 78   Temp (!) 97.4 F (36.3 C) (Oral)   Resp 17   Ht 5\' 6"  (1.676 m)   Wt 131.7 kg   LMP 06/06/2023   SpO2 96%   BMI 46.87 kg/m  GENERAL: Well-developed, well-nourished female in no acute distress.  HEAD: Normocephalic, atraumatic.  CHEST: Normal effort of breathing, regular heart rate ABDOMEN: Soft, nontender, gravid PELVIC: Normal external female genitalia. Vagina is pink and rugated. Cervix with normal contour, no lesions. Normal discharge.  + pooling, small amount of pink/clear fluid in the vagina.   Cervical exam:  Dilation: 2.5 Exam by:: Almond Jaffe, NP.   Fetal Monitoring: Baseline: 130 bpm Variability: Moderate  Accelerations: 15x15 Decelerations: Variable x 2 Contractions: Irregular.   Pt informed that the ultrasound is considered a limited OB ultrasound and is not intended to be a complete ultrasound exam.  Patient also informed that the ultrasound is not being completed with the intent of assessing for fetal or placental anomalies or any pelvic abnormalities.  Explained that the purpose of today's ultrasound is to assess for  presentation.  Patient acknowledges the purpose of the exam and the limitations of the study.     Vertex position confirmed.   A: SIUP at [redacted]w[redacted]d  SROM + Pooling + Fern slide   P:  RN to notify labor team. GBS positive BP normal   Wrigley Winborne, Juliette Oh, NP 03/05/2024 10:04 AM

## 2024-03-05 NOTE — Lactation Note (Signed)
 This note was copied from a baby's chart. Lactation Consultation Note  Patient Name: Boy Annett Boxwell [id number] Date: 03/05/2024 Age:30 hours Reason for consult: Initial assessment;Exclusive pumping and bottle feeding;Early term 37-38.6wks.  MOB decided to pump only and formula feed infant. MOB attempted to latch infant but stopped after 2 minutes did not like the way if felt. MOB was set up with the DEBP and fitted with 24 mm breast flange. MOB pace feed infant 10 mls of 20 kcal formula and afterwards using the DEBP when LC left the room. MOB will continue to pump every 3 hours for 15 minutes and will offer infant any of her EBM first and then formula. MOB knows that her EBM is safe for 4 hours whereas formula once open must be used within 1 hour. MOB will continue to feed infant by cues, on demand, 8+ times within 24 hours, skin to skin. LC discussed the importance of meals, snacks and hydration. MOB was  made aware of O/P services, breastfeeding support groups, community resources, and our phone # for post-discharge questions.    Maternal Data Has patient been taught Hand Expression?: Yes Does the patient have breastfeeding experience prior to this delivery?: Yes How long did the patient breastfeed?: Per MOB, she had latch difficulties with other children and stopped after two days,she attempted to   latch infant but did not like the way it feel. So she decided to pump only.  Feeding Mother's Current Feeding Choice: Breast Milk and Formula  LATCH Score Latch: Grasps breast easily, tongue down, lips flanged, rhythmical sucking.  Audible Swallowing: A few with stimulation  Type of Nipple: Everted at rest and after stimulation  Comfort (Breast/Nipple): Soft / non-tender  Hold (Positioning): Assistance needed to correctly position infant at breast and maintain latch.  LATCH Score: 8   Lactation Tools Discussed/Used Tools: Flanges;Pump Flange Size: 24 Breast pump type: Double-Electric  Breast Pump Pump Education: Milk Storage Reason for Pumping: MOB feeding choice Pumping frequency: MOB will continue to use the DEBP every 3 hours for 15 minutes.  Interventions Interventions: Breast feeding basics reviewed;Assisted with latch;Skin to skin;Breast compression;Adjust position;Support pillows;Position options;Expressed milk;DEBP;Education;Pace feeding;CDC milk storage guidelines;LC Services brochure;Guidelines for Milk Supply and Pumping Schedule Handout  Discharge Pump: Referral sent for [facility] Pump  Consult Status Consult Status: Follow-up Date: 03/06/24 Follow-up type: In-patient    Pecolia Bourbon 03/05/2024, 11:14 PM

## 2024-03-05 NOTE — Progress Notes (Signed)
 LABOR PROGRESS NOTE  Patient Name: Anita Herrera, female   DOB: Feb 26, 1996, 28 y.o.  MRN: [id number]  Cat 1, pt agreeable to check, fore bag noted, agreeable to rupture, AROM successful, clear fluid, mom and baby tolerated well.   Ebony Goldstein, MD

## 2024-03-05 NOTE — H&P (Addendum)
 OBSTETRIC ADMISSION HISTORY AND PHYSICAL  Anita Herrera is a 28 y.o. female [id number] with IUP at [redacted]w[redacted]d by LMP admitted for Fern+ SROM (originally planned for IOL for cHTN @39wk ). She reports +FMs, No LOF, no VB, no blurry vision, headaches or peripheral edema, and RUQ pain.  She plans on breast feeding. She requests OP IUD for birth control. She received her prenatal care at [facility].   Dating: By LMP --->  Estimated Date of Delivery: 03/13/24  Sono:   @[redacted]w[redacted]d , CWD, normal anatomy, cephalic presentation,  3659 g, 91% EFW   Prenatal History/Complications:  - Late to prenatal care (initiated ~19 wks)  - Hx LGSIL: NILM pap on 10/23/23 - Hx shoulder dystocia in prior pregnancy with LGA  - Chronic HTN: On Labetolol, ASA - GBS+: plan for PCN in labor  - Recurrent UTI: On Keflex  for suppression  - Maternal echo 02/26/24 with mild LVH, tricuspid regurg. Followed by Cardiology.  - Echogenic bowel and polyhydramnios resolved during pregnancy   Past Medical History: Past Medical History:  Diagnosis Date   Anemia    Hypertension    Shingles    Vaginitis     Past Surgical History: Past Surgical History:  Procedure Laterality Date   WISDOM TOOTH EXTRACTION      Obstetrical History: OB History     Gravida  8   Para  3   Term  3   Preterm  0   AB  4   Living  3      SAB  1   IAB  3   Ectopic  0   Multiple  0   Live Births  3           Social History Social History   Socioeconomic History   Marital status: Single    Spouse name: Not on file   Number of children: Not on file   Years of education: Not on file   Highest education level: Not on file  Occupational History   Not on file  Tobacco Use   Smoking status: Former    Current packs/day: 0.00    Types: Cigarettes    Quit date: 07/2023    Years since quitting: 0.6   Smokeless tobacco: Never  Vaping Use   Vaping status: Never Used  Substance and Sexual Activity   Alcohol use: Not Currently     Alcohol/week: 2.0 standard drinks of alcohol    Types: 2 Shots of liquor per week    Comment: Not since confirmed pregnancy   Drug use: No   Sexual activity: Yes  Other Topics Concern   Not on file  Social History Narrative         Social Drivers of Health   Financial Resource Strain: Not on file  Food Insecurity: No Food Insecurity (03/05/2024)   Hunger Vital Sign    Worried About Running Out of Food in the Last Year: Never true    Ran Out of Food in the Last Year: Never true  Transportation Needs: No Transportation Needs (03/05/2024)   PRAPARE - Administrator, Civil Service (Medical): No    Lack of Transportation (Non-Medical): No  Physical Activity: Not on file  Stress: Not on file  Social Connections: Not on file    Family History: Family History  Problem Relation Age of Onset   Diabetes Mother    Hypertension Maternal Grandmother    Asthma Neg Hx    Cancer Neg Hx    Birth  defects Neg Hx    Heart disease Neg Hx    Stroke Neg Hx     Allergies: No Known Allergies   Medications Prior to Admission  Medication Sig Dispense Refill Last Dose/Taking   aspirin  EC 81 MG tablet Take 2 tablets (162 mg total) by mouth daily. Swallow whole. 60 tablet 12 03/05/2024 at  8:00 AM   cephALEXin  (KEFLEX ) 500 MG capsule Take 1 capsule (500 mg total) by mouth 2 (two) times daily. 30 capsule 1 03/05/2024 at  8:00 AM   labetalol  (NORMODYNE ) 200 MG tablet Take 1 tablet (200 mg total) by mouth 3 (three) times daily. 90 tablet 2 03/05/2024 at  8:00 AM   pantoprazole  (PROTONIX ) 20 MG tablet Take 1 tablet (20 mg total) by mouth daily. 30 tablet 1 03/05/2024 at  8:00 AM   Prenatal 28-0.8 MG TABS Take 1 tablet by mouth daily. 30 tablet 11 03/05/2024 at  8:00 AM     Review of Systems   Per HPI  Blood pressure 135/78, pulse 84, temperature (!) 97.5 F (36.4 C), temperature source Oral, resp. rate 20, height 5\' 6"  (1.676 m), weight 131.7 kg, last menstrual period 06/06/2023, SpO2 98%,  not currently breastfeeding. General appearance: alert, cooperative, and no distress Lungs: normal WOB on RA. clear to auscultation bilaterally anteriorly  Heart: regular rate and rhythm, warm and well-perfused  Abdomen: soft, non-tender Extremities: BLE without erythema, edema, or tenderness  Presentation: cephalic Fetal monitoring Baseline: 130 bpm, Variability: Good {> 6 bpm), Accelerations: Reactive, and Decelerations: Absent Uterine activity: Variable  Dilation: 2.5 Exam by:: Almond Jaffe, NP.  Prenatal labs: ABO, Rh: --/--/PENDING (05/30 1044) Antibody: PENDING (05/30 1044) Rubella: 1.23 (12/30 0937) RPR: Non Reactive (03/12 0914)  HBsAg: Negative (12/30 0865)  HIV: Non Reactive (03/12 0914)  GBS: Positive/-- (05/16 1114)  GTT 12/17/23: 175 @ 1hr, 116 @ 2 hr Genetic screening:  Low risk female  Anatomy US : US  does not comment on sex. Low risk female on NIPS.   Prenatal Transfer Tool  Maternal Diabetes: No Genetic Screening: Normal Maternal Ultrasounds/Referrals: Echogenic bowel and polyhydramnios resolved during pregnancy. Fetal Ultrasounds or other Referrals:  Referred to Materal Fetal Medicine  Maternal Substance Abuse:  No, quit smoking about 3 months prior to initial OB visit @19  wks Significant Maternal Medications:  Labetolol, ASA, Keflex  Significant Maternal Lab Results: Group B Strep positive  Results for orders placed or performed during the hospital encounter of 03/05/24 (from the past 24 hours)  Fern Test   Collection Time: 03/05/24  9:41 AM  Result Value Ref Range   POCT Fern Test Positive = ruptured amniotic membanes   Urinalysis, Routine w reflex microscopic -Urine, Clean Catch   Collection Time: 03/05/24  9:52 AM  Result Value Ref Range   Color, Urine AMBER (A) YELLOW   APPearance CLOUDY (A) CLEAR   Specific Gravity, Urine 1.018 1.005 - 1.030   pH 6.0 5.0 - 8.0   Glucose, UA NEGATIVE NEGATIVE mg/dL   Hgb urine dipstick LARGE (A) NEGATIVE   Bilirubin  Urine NEGATIVE NEGATIVE   Ketones, ur NEGATIVE NEGATIVE mg/dL   Protein, ur 784 (A) NEGATIVE mg/dL   Nitrite NEGATIVE NEGATIVE   Leukocytes,Ua SMALL (A) NEGATIVE   RBC / HPF >50 0 - 5 RBC/hpf   WBC, UA 21-50 0 - 5 WBC/hpf   Bacteria, UA RARE (A) NONE SEEN   Squamous Epithelial / HPF 6-10 0 - 5 /HPF   Mucus PRESENT   CBC   Collection Time: 03/05/24  10:40 AM  Result Value Ref Range   WBC 8.3 4.0 - 10.5 K/uL   RBC 3.76 (L) 3.87 - 5.11 MIL/uL   Hemoglobin 11.1 (L) 12.0 - 15.0 g/dL   HCT 16.1 (L) 09.6 - 04.5 %   MCV 90.4 80.0 - 100.0 fL   MCH 29.5 26.0 - 34.0 pg   MCHC 32.6 30.0 - 36.0 g/dL   RDW 40.9 81.1 - 91.4 %   Platelets 226 150 - 400 K/uL   nRBC 0.0 0.0 - 0.2 %  Comprehensive metabolic panel   Collection Time: 03/05/24 10:40 AM  Result Value Ref Range   Sodium 135 135 - 145 mmol/L   Potassium 4.1 3.5 - 5.1 mmol/L   Chloride 107 98 - 111 mmol/L   CO2 18 (L) 22 - 32 mmol/L   Glucose, Bld 92 70 - 99 mg/dL   BUN 6 6 - 20 mg/dL   Creatinine, Ser 7.82 0.44 - 1.00 mg/dL   Calcium 9.4 8.9 - 95.6 mg/dL   Total Protein 6.7 6.5 - 8.1 g/dL   Albumin 2.7 (L) 3.5 - 5.0 g/dL   AST 32 15 - 41 U/L   ALT 47 (H) 0 - 44 U/L   Alkaline Phosphatase 114 38 - 126 U/L   Total Bilirubin 0.3 0.0 - 1.2 mg/dL   GFR, Estimated >21 >30 mL/min   Anion gap 10 5 - 15  Type and screen [facility]   Collection Time: 03/05/24 10:44 AM  Result Value Ref Range   ABO/RH(D) PENDING    Antibody Screen PENDING    Sample Expiration      03/08/2024,2359 Performed at [facility], [address] [place], [place] [address]     Patient Active Problem List   Diagnosis Date Noted   Normal labor 03/05/2024   GBS (group B Streptococcus carrier), +RV culture, currently pregnant 02/23/2024   Asymptomatic bacteriuria during pregnancy 02/20/2024   Abnormal fetal ultrasound, echogenic bowel 12/31/2023   Chronic hypertension affecting pregnancy 12/19/2023   Late prenatal care affecting  pregnancy 10/23/2023   Obesity affecting pregnancy, antepartum 10/23/2023   Supervision of high risk pregnancy, antepartum 10/06/2023   History of shoulder dystocia in prior pregnancy-LGA 08/05/2022   LGSIL of cervix of undetermined significance 02/26/2022    Assessment/Plan:  DARLEENE CUMPIAN is a 28 y.o. [id number] at [redacted]w[redacted]d here for Fern+ SROM (originally planned for IOL for cHTN @39wk ).   #Labor: SROM (originally planned for IOL for cHTN @39wk ). Start pitocin  after meal.  #Pain: Epidural requested  #FWB: Cat 1 #ID:  GBS+: Penicillin  q4 prior to delivery  #MOF: Breast #MOC: OP IUD #Circ:  Desired    #CHTN: Labetalol  200mg  TID, asymptomatic, normotensive, Plts and LFTs stable - f/u [facility]  #Late to [facility]:  #Obesity: 47 kg/m2 #Hx of shoulder dystocia: 2016, 4230g, <30sec, relieved with McRoberts and Suprapubic pressure, normal SVD 2023, @[redacted]w[redacted]d , 3659 g, 91% EFW - shoulder precautions  - TXA at delivery   #Bacteruria: compliant with Keflex  500mg  BID   Carey Chapman, MD  Center for [facility], [facility] 03/05/2024, 11:50 AM  Evaluation and management procedures were performed by the [facility] Medicine Resident under my supervision. I was immediately available for direct supervision, assistance and direction throughout this encounter.  I also confirm that I have verified the information documented in the resident's note, and that I have also personally reperformed the pertinent components of the physical exam and all of the medical decision making activities.  I have also made any necessary editorial changes.   Candice Chalet, MD Family Medicine - Obstetrics Fellow

## 2024-03-05 NOTE — Anesthesia Procedure Notes (Signed)
 Epidural Patient location during procedure: OB Start time: 03/05/2024 5:32 PM End time: 03/05/2024 5:38 PM  Staffing Anesthesiologist: Peggy Bowens, MD Performed: anesthesiologist   Preanesthetic Checklist Completed: patient identified, IV checked, site marked, risks and benefits discussed, surgical consent, monitors and equipment checked, pre-op evaluation and timeout performed  Epidural Patient position: sitting Prep: DuraPrep and site prepped and draped Patient monitoring: continuous pulse ox and blood pressure Approach: midline Location: L4-L5 Injection technique: LOR air  Needle:  Needle type: Tuohy  Needle gauge: 17 G Needle length: 9 cm and 9 Needle insertion depth: 8 cm Catheter type: closed end flexible Catheter size: 19 Gauge Catheter at skin depth: 14 cm Test dose: negative  Assessment Events: blood not aspirated, no cerebrospinal fluid, injection not painful, no injection resistance, no paresthesia and negative IV test

## 2024-03-05 NOTE — Anesthesia Preprocedure Evaluation (Signed)
 Anesthesia Evaluation  Patient identified by MRN, date of birth, ID band Patient awake    Reviewed: Allergy & Precautions, Patient's Chart, lab work & pertinent test results  Airway Mallampati: II  TM Distance: >3 FB     Dental   Pulmonary former smoker   Pulmonary exam normal        Cardiovascular hypertension (CHTN), Pt. on home beta blockers Normal cardiovascular exam     Neuro/Psych negative neurological ROS     GI/Hepatic negative GI ROS, Neg liver ROS,,,  Endo/Other    Class 3 obesity  Renal/GU negative Renal ROS     Musculoskeletal   Abdominal   Peds  Hematology  (+) Blood dyscrasia, anemia   Anesthesia Other Findings   Reproductive/Obstetrics (+) Pregnancy                             Anesthesia Physical Anesthesia Plan  ASA: 3  Anesthesia Plan: Epidural   Post-op Pain Management:    Induction:   PONV Risk Score and Plan: 2  Airway Management Planned: Natural Airway  Additional Equipment:   Intra-op Plan:   Post-operative Plan:   Informed Consent: I have reviewed the patients History and Physical, chart, labs and discussed the procedure including the risks, benefits and alternatives for the proposed anesthesia with the patient or authorized representative who has indicated his/her understanding and acceptance.       Plan Discussed with:   Anesthesia Plan Comments:        Anesthesia Quick Evaluation

## 2024-03-06 ENCOUNTER — Inpatient Hospital Stay (HOSPITAL_COMMUNITY): Admission: RE | Admit: 2024-03-06 | Source: Ambulatory Visit

## 2024-03-06 ENCOUNTER — Inpatient Hospital Stay (HOSPITAL_COMMUNITY): Admission: RE | Admit: 2024-03-06 | Source: Home / Self Care | Admitting: Obstetrics & Gynecology

## 2024-03-06 LAB — CBC
HCT: 31 % — ABNORMAL LOW (ref 36.0–46.0)
Hemoglobin: 10 g/dL — ABNORMAL LOW (ref 12.0–15.0)
MCH: 29.4 pg (ref 26.0–34.0)
MCHC: 32.3 g/dL (ref 30.0–36.0)
MCV: 91.2 fL (ref 80.0–100.0)
Platelets: 212 10*3/uL (ref 150–400)
RBC: 3.4 MIL/uL — ABNORMAL LOW (ref 3.87–5.11)
RDW: 14.3 % (ref 11.5–15.5)
WBC: 10.1 10*3/uL (ref 4.0–10.5)
nRBC: 0 % (ref 0.0–0.2)

## 2024-03-06 MED ORDER — FUROSEMIDE 20 MG PO TABS: 20.0000 mg | ORAL_TABLET | Freq: Every day | ORAL | 0 refills | Status: DC

## 2024-03-06 MED ORDER — SENNOSIDES-DOCUSATE SODIUM 8.6-50 MG PO TABS: 2.0000 | ORAL_TABLET | ORAL | 0 refills | Status: AC

## 2024-03-06 MED ORDER — POTASSIUM CHLORIDE CRYS ER 20 MEQ PO TBCR: 20.0000 meq | EXTENDED_RELEASE_TABLET | Freq: Every day | ORAL | 0 refills | Status: DC

## 2024-03-06 MED ORDER — IBUPROFEN 600 MG PO TABS: 600.0000 mg | ORAL_TABLET | Freq: Four times a day (QID) | ORAL | 0 refills | Status: AC

## 2024-03-06 NOTE — Anesthesia Postprocedure Evaluation (Signed)
 Anesthesia Post Note  Patient: Anita Herrera  Procedure(s) Performed: AN AD HOC LABOR EPIDURAL     Patient location during evaluation: Mother Baby Anesthesia Type: Epidural Level of consciousness: awake and alert and oriented Pain management: satisfactory to patient Vital Signs Assessment: post-procedure vital signs reviewed and stable Respiratory status: respiratory function stable Cardiovascular status: stable Postop Assessment: no headache, no backache, epidural receding, patient able to bend at knees, no signs of nausea or vomiting, adequate PO intake and able to ambulate Anesthetic complications: no   No notable events documented.  Last Vitals:  Vitals:   03/05/24 2333 03/06/24 0311  BP: 135/76 135/84  Pulse: 89 77  Resp: 18 18  Temp: 36.7 C 36.8 C  SpO2: 99% 99%    Last Pain:  Vitals:   03/06/24 0701  TempSrc:   PainSc: 0-No pain   Pain Goal:                   [name]

## 2024-03-06 NOTE — Lactation Note (Signed)
 This note was copied from a baby's chart. Lactation Consultation Note  Patient Name: Anita Herrera [id number] Date: 03/06/2024 Age:30 hours  LC spoke with the [facility] [employer] rep and due to [facility] not eligible for a DEBP.      Renda Carpen Rayner Erman 03/06/2024, 11:17 AM

## 2024-03-06 NOTE — Progress Notes (Signed)
 POSTPARTUM PROGRESS NOTE  Subjective: Anita Herrera is a 28 y.o. [id number] s/p SVD at [redacted]w[redacted]d.  She reports she is doing well. No acute events overnight. She denies any problems with ambulating, voiding or po intake. Denies nausea or vomiting. She has passed flatus. Pain is well controlled.  Lochia is moderate.  Objective: Blood pressure 135/84, pulse 77, temperature 98.2 F (36.8 C), temperature source Oral, resp. rate 18, height 5\' 6"  (1.676 m), weight 131.7 kg, last menstrual period 06/06/2023, SpO2 99%, not currently breastfeeding.  Physical Exam:  General: alert, cooperative and no distress Chest: no respiratory distress Abdomen: soft, non-tender  Uterine Fundus: firm and at level of umbilicus Extremities: No calf swelling or tenderness  Trace edema  Recent Labs    03/05/24 1704 03/06/24 0502  HGB 11.0* 10.0*  HCT 33.5* 31.0*    Assessment/Plan: Anita Herrera is a 28 y.o. [id number] s/p SVD at [redacted]w[redacted]d.  Routine Postpartum Care: Doing well, pain well-controlled.  -- Continue routine care, lactation support  -- Contraception: OP IUD -- Feeding: breast  -- cHTN: well-controlled. Continue home labetalol . Lasix /k x5 days  Dispo: Plan for discharge this evening if baby able to.  Maud Sorenson, MD OB Fellow 03/06/2024 8:25 AM

## 2024-03-11 ENCOUNTER — Ambulatory Visit

## 2024-03-16 ENCOUNTER — Encounter: Payer: Self-pay | Admitting: Cardiology

## 2024-03-16 ENCOUNTER — Ambulatory Visit: Attending: Cardiology | Admitting: Cardiology

## 2024-03-16 VITALS — Ht 66.0 in | Wt 200.0 lb

## 2024-03-16 DIAGNOSIS — Z3A00 Weeks of gestation of pregnancy not specified: Secondary | ICD-10-CM | POA: Diagnosis not present

## 2024-03-16 DIAGNOSIS — O165 Unspecified maternal hypertension, complicating the puerperium: Secondary | ICD-10-CM

## 2024-03-16 NOTE — Patient Instructions (Signed)
Medication Instructions:  Your physician recommends that you continue on your current medications as directed. Please refer to the Current Medication list given to you today.  *If you need a refill on your cardiac medications before your next appointment, please call your pharmacy*   Lab Work: None If you have labs (blood work) drawn today and your tests are completely normal, you will receive your results only by: . MyChart Message (if you have MyChart) OR . A paper copy in the mail If you have any lab test that is abnormal or we need to change your treatment, we will call you to review the results.   Testing/Procedures: None   Follow-Up: At [facility], you and your health needs are our priority.  As part of our continuing mission to provide you with exceptional heart care, we have created designated Provider Care Teams.  These Care Teams include your primary Cardiologist (physician) and Advanced Practice Providers (APPs -  Physician Assistants and Nurse Practitioners) who all work together to provide you with the care you need, when you need it.  We recommend signing up for the patient portal called "MyChart".  Sign up information is provided on this After Visit Summary.  MyChart is used to connect with patients for Virtual Visits (Telemedicine).  Patients are able to view lab/test results, encounter notes, upcoming appointments, etc.  Non-urgent messages can be sent to your provider as well.   To learn more about what you can do with MyChart, go to [email or web address].    Your next appointment:   6 week(s)  The format for your next appointment:   In Person  Provider:   Kardie Tobb, DO   Other Instructions   

## 2024-03-16 NOTE — Progress Notes (Signed)
 Cardio-Obstetrics Clinic  Follow Up Note   Date:  03/20/2024   ID:  [name], DOB Jul 07, 1996, MRN [id number]  PCP:  Patient, No Pcp Per   [facility] Providers Cardiologist:  Jerryl Morin, DO  Electrophysiologist:  None        Referring MD: No ref. provider found   Chief Complaint:   I am in the office. She is at home.  Virtual Visit via Video  Note . I connected with the patient today by a   video enabled telemedicine application and verified that I am speaking with the correct person using two identifiers.    History of Present Illness:    JOSETTA Herrera is a 28 y.o. female [[id number]] who returns for follow up of chronic  hypertensin in pregnancy. who presents for postpartum blood pressure management.  She is one week and four days postpartum and currently breastfeeding while supplementing with formula. She was discharged on labetalol  and Lasix  for blood pressure management. She has not yet obtained a blood pressure cuff for home monitoring. She missed a previous appointment due to labor   Prior CV Studies Reviewed: The following studies were reviewed today: Echo   Past Medical History:  Diagnosis Date   Anemia    Hypertension    Shingles    Vaginitis     Past Surgical History:  Procedure Laterality Date   WISDOM TOOTH EXTRACTION        OB History     Gravida  8   Para  3   Term  3   Preterm  0   AB  4   Living  3      SAB  1   IAB  3   Ectopic  0   Multiple  0   Live Births  3               Current Medications: Current Meds  Medication Sig   ibuprofen  (ADVIL ) 600 MG tablet Take 1 tablet (600 mg total) by mouth every 6 (six) hours.   labetalol  (NORMODYNE ) 200 MG tablet Take 1 tablet (200 mg total) by mouth 3 (three) times daily.   pantoprazole  (PROTONIX ) 20 MG tablet Take 1 tablet (20 mg total) by mouth daily.   Prenatal 28-0.8 MG TABS Take 1 tablet by mouth daily.   senna-docusate (SENOKOT-S) 8.6-50 MG  tablet Take 2 tablets by mouth daily.     Allergies:   Patient has no known allergies.   Social History   Socioeconomic History   Marital status: Single    Spouse name: Not on file   Number of children: Not on file   Years of education: Not on file   Highest education level: Not on file  Occupational History   Not on file  Tobacco Use   Smoking status: Former    Current packs/day: 0.00    Types: Cigarettes    Quit date: 07/2023    Years since quitting: 0.7   Smokeless tobacco: Never  Vaping Use   Vaping status: Never Used  Substance and Sexual Activity   Alcohol use: Not Currently    Alcohol/week: 2.0 standard drinks of alcohol    Types: 2 Shots of liquor per week    Comment: Not since confirmed pregnancy   Drug use: No   Sexual activity: Yes  Other Topics Concern   Not on file  Social History Narrative         Social Drivers of Health  Financial Resource Strain: Not on file  Food Insecurity: No Food Insecurity (03/05/2024)   Hunger Vital Sign    Worried About Running Out of Food in the Last Year: Never true    Ran Out of Food in the Last Year: Never true  Transportation Needs: No Transportation Needs (03/05/2024)   PRAPARE - Administrator, Civil Service (Medical): No    Lack of Transportation (Non-Medical): No  Physical Activity: Not on file  Stress: Not on file  Social Connections: Not on file      Family History  Problem Relation Age of Onset   Diabetes Mother    Hypertension Maternal Grandmother    Asthma Neg Hx    Cancer Neg Hx    Birth defects Neg Hx    Heart disease Neg Hx    Stroke Neg Hx       ROS:   Please see the history of present illness.     All other systems reviewed and are negative.   Labs/EKG Reviewed:    EKG:  none today   Recent Labs: 01/17/2024: B Natriuretic Peptide 15.0 03/05/2024: ALT 47; BUN 6; Creatinine, Ser 0.45; Potassium 4.1; Sodium 135 03/06/2024: Hemoglobin 10.0; Platelets 212   Recent Lipid  Panel No results found for: CHOL, TRIG, HDL, CHOLHDL, LDLCALC, LDLDIRECT  Physical Exam:    VS:  Ht 5' 6 (1.676 m)   Wt 200 lb (90.7 kg)   LMP 06/06/2023   BMI 32.28 kg/m     Wt Readings from Last 3 Encounters:  03/16/24 200 lb (90.7 kg)  03/05/24 290 lb 6.4 oz (131.7 kg)  03/04/24 289 lb (131.1 kg)       Risk Assessment/Risk Calculators:                 ASSESSMENT & PLAN:    Hypertension Postpartum hypertension managed with labetalol  and furosemide . Blood pressure monitoring emphasized to maintain levels below 130/80 mmHg. Explained importance of daily monitoring and reporting readings over 140/90 mmHg. - Continue labetalol  and furosemide . - Instruct to obtain sphygmomanometer and monitor blood pressure daily. - Advise to report readings over 140/90 mmHg for medication adjustment. - Follow-up in six weeks.  Total time spent 12 minutes  Patient Instructions  Medication Instructions:  Your physician recommends that you continue on your current medications as directed. Please refer to the Current Medication list given to you today.    *If you need a refill on your cardiac medications before your next appointment, please call your pharmacy*   Lab Work: None   If you have labs (blood work) drawn today and your tests are completely normal, you will receive your results only by: MyChart Message (if you have MyChart) OR A paper copy in the mail If you have any lab test that is abnormal or we need to change your treatment, we will call you to review the results.   Testing/Procedures: None    Follow-Up: At [facility], you and your health needs are our priority.  As part of our continuing mission to provide you with exceptional heart care, we have created designated Provider Care Teams.  These Care Teams include your primary Cardiologist (physician) and Advanced Practice Providers (APPs -  Physician Assistants and Nurse Practitioners) who all work  together to provide you with the care you need, when you need it.  We recommend signing up for the patient portal called MyChart.  Sign up information is provided on this After Visit Summary.  MyChart is  used to connect with patients for Virtual Visits (Telemedicine).  Patients are able to view lab/test results, encounter notes, upcoming appointments, etc.  Non-urgent messages can be sent to your provider as well.   To learn more about what you can do with MyChart, go to [email or web address].    Your next appointment:   6 week(s)  The format for your next appointment:   In Person  Provider:   Kaitlynn Tramontana, DO   Other Instructions     Dispo:  No follow-ups on file.   Medication Adjustments/Labs and Tests Ordered: Current medicines are reviewed at length with the patient today.  Concerns regarding medicines are outlined above.  Tests Ordered: No orders of the defined types were placed in this encounter.  Medication Changes: No orders of the defined types were placed in this encounter.

## 2024-03-17 ENCOUNTER — Ambulatory Visit

## 2024-03-17 DIAGNOSIS — Z0130 Encounter for examination of blood pressure without abnormal findings: Secondary | ICD-10-CM

## 2024-03-17 NOTE — Progress Notes (Signed)
 Subjective:  Anita Herrera is a 28 y.o. female here for BP check. Pt is compliant with taking medication. She is monitoring her BP at home; however, has not checked it today.  Hypertension ROS: taking medications as instructed, no medication side effects noted, no TIA's, no chest pain on exertion, no dyspnea on exertion, and no swelling of ankles.    Objective:  BP 129/84   Pulse (!) 56   LMP 06/06/2023   Breastfeeding Unknown   Appearance alert, well appearing, and in no distress. General exam BP noted to be well controlled today in office.    Assessment:   Blood Pressure well controlled.   Plan:  Orders and follow up as documented in patient record.Aaron Aas

## 2024-03-21 ENCOUNTER — Ambulatory Visit
Admission: EM | Admit: 2024-03-21 | Discharge: 2024-03-21 | Disposition: A | Discharge status: Home / Self Care | Attending: Emergency Medicine | Admitting: Emergency Medicine

## 2024-03-21 DIAGNOSIS — L03011 Cellulitis of right finger: Secondary | ICD-10-CM | POA: Diagnosis not present

## 2024-03-21 MED ORDER — MUPIROCIN 2 % EX OINT: 1.0000 | TOPICAL_OINTMENT | Freq: Two times a day (BID) | CUTANEOUS | 0 refills | Status: AC

## 2024-03-21 MED ORDER — AMOXICILLIN-POT CLAVULANATE 875-125 MG PO TABS
1.0000 | ORAL_TABLET | Freq: Two times a day (BID) | ORAL | 0 refills | Status: AC
Start: 2024-03-21 — End: 2024-03-28

## 2024-03-21 NOTE — Discharge Instructions (Signed)
 Please take medication as prescribed. Take with food to avoid upset stomach. Finish all the pills  Soak finger in warm water several times daily Apply bactroban  ointment twice daily after soaking

## 2024-03-21 NOTE — ED Provider Notes (Signed)
 [facility]    CSN: [id number] Arrival date & time: 03/21/24  1342      History   Chief Complaint Chief Complaint  Patient presents with   finger sweling    HPI Anita Herrera is a 28 y.o. female.  2-day history of right hand middle finger pain.  Noticed swelling around the base of the nail.  She is a nail biter.  Reports history of this infection.  Had leftover Bactroban  from March of last year visit and tried this. No direct injury or trauma  Past Medical History:  Diagnosis Date   Anemia    Hypertension    Shingles    Vaginitis     Patient Active Problem List   Diagnosis Date Noted   Normal labor 03/05/2024   SVD (spontaneous vaginal delivery) 03/05/2024   GBS (group B Streptococcus carrier), +RV culture, currently pregnant 02/23/2024   Asymptomatic bacteriuria during pregnancy 02/20/2024   Abnormal fetal ultrasound, echogenic bowel 12/31/2023   Chronic hypertension affecting pregnancy 12/19/2023   Late prenatal care affecting pregnancy 10/23/2023   Obesity affecting pregnancy, antepartum 10/23/2023   Supervision of high risk pregnancy, antepartum 10/06/2023   History of shoulder dystocia in prior pregnancy-LGA 08/05/2022   LGSIL of cervix of undetermined significance 02/26/2022    Past Surgical History:  Procedure Laterality Date   WISDOM TOOTH EXTRACTION      OB History     Gravida  8   Para  3   Term  3   Preterm  0   AB  4   Living  3      SAB  1   IAB  3   Ectopic  0   Multiple  0   Live Births  3            Home Medications    Prior to Admission medications   Medication Sig Start Date End Date Taking? Authorizing Provider  amoxicillin -clavulanate (AUGMENTIN ) 875-125 MG tablet Take 1 tablet by mouth every 12 (twelve) hours for 7 days. 03/21/24 03/28/24 Yes Abigale Dorow, Ivette Marks, PA-C  mupirocin  ointment (BACTROBAN ) 2 % Apply 1 Application topically 2 (two) times daily. 03/21/24  Yes Paraskevi Funez, Ivette Marks, PA-C  ibuprofen   (ADVIL ) 600 MG tablet Take 1 tablet (600 mg total) by mouth every 6 (six) hours. 03/06/24   Kumar, Agnijita, MD  labetalol  (NORMODYNE ) 200 MG tablet Take 1 tablet (200 mg total) by mouth 3 (three) times daily. 02/26/24   Tobb, Kardie, DO  pantoprazole  (PROTONIX ) 20 MG tablet Take 1 tablet (20 mg total) by mouth daily. 01/17/24   Ebony Goldstein, MD  Prenatal 28-0.8 MG TABS Take 1 tablet by mouth daily. 10/06/23   Constant, Peggy, MD  senna-docusate (SENOKOT-S) 8.6-50 MG tablet Take 2 tablets by mouth daily. 03/07/24   Melanie Spires, MD    Family History Family History  Problem Relation Age of Onset   Diabetes Mother    Hypertension Maternal Grandmother    Asthma Neg Hx    Cancer Neg Hx    Birth defects Neg Hx    Heart disease Neg Hx    Stroke Neg Hx     Social History Social History   Tobacco Use   Smoking status: Former    Current packs/day: 0.00    Types: Cigarettes    Quit date: 07/2023    Years since quitting: 0.7   Smokeless tobacco: Never  Vaping Use   Vaping status: Never Used  Substance Use Topics   Alcohol  use: Not Currently    Alcohol/week: 2.0 standard drinks of alcohol    Types: 2 Shots of liquor per week    Comment: Not since confirmed pregnancy   Drug use: No     Allergies   Patient has no known allergies.   Review of Systems Review of Systems  As per HPI  Physical Exam Triage Vital Signs ED Triage Vitals  Encounter Vitals Group     BP 03/21/24 1410 121/82     Girls Systolic BP Percentile --      Girls Diastolic BP Percentile --      Boys Systolic BP Percentile --      Boys Diastolic BP Percentile --      Pulse Rate 03/21/24 1410 (!) 57     Resp 03/21/24 1410 18     Temp 03/21/24 1410 98.3 F (36.8 C)     Temp Source 03/21/24 1410 Oral     SpO2 03/21/24 1410 97 %     Weight --      Height --      Head Circumference --      Peak Flow --      Pain Score 03/21/24 1408 8     Pain Loc --      Pain Education --      Exclude from Growth Chart  --    No data found.  Updated Vital Signs BP 121/82   Pulse (!) 57   Temp 98.3 F (36.8 C) (Oral)   Resp 18   LMP  (LMP Unknown)   SpO2 97%   Breastfeeding Yes    Physical Exam Vitals and nursing note reviewed.  Constitutional:      General: She is not in acute distress.    Appearance: Normal appearance.  HENT:     Mouth/Throat:     Pharynx: Oropharynx is clear.   Cardiovascular:     Rate and Rhythm: Normal rate and regular rhythm.     Pulses: Normal pulses.     Heart sounds: Normal heart sounds.  Pulmonary:     Effort: Pulmonary effort is normal.     Breath sounds: Normal breath sounds.   Musculoskeletal:     Comments: Right hand middle finger nail base is erythematous and tender.  There is slight yellow drainage at the lateral nail edge.  No fluctuance at this time.  Distal sensation is intact.  Cap refill less than 2 seconds   Skin:    General: Skin is warm and dry.     Capillary Refill: Capillary refill takes less than 2 seconds.   Neurological:     Mental Status: She is alert and oriented to person, place, and time.     UC Treatments / Results  Labs (all labs ordered are listed, but only abnormal results are displayed) Labs Reviewed - No data to display  EKG   Radiology No results found.  Procedures Procedures (including critical care time)  Medications Ordered in UC Medications - No data to display  Initial Impression / Assessment and Plan / UC Course  I have reviewed the triage vital signs and the nursing notes.  Pertinent labs & imaging results that were available during my care of the patient were reviewed by me and considered in my medical decision making (see chart for details).  Paronychia Augmentin  twice daily for 7 days, cover for oral flora given nailbiting.  Warm soaks frequently and Bactroban  twice daily.  Advised reasons to return to clinic.  Patient agrees  to plan, no questions  Final Clinical Impressions(s) / UC Diagnoses    Final diagnoses:  Paronychia of right middle finger     Discharge Instructions      Please take medication as prescribed. Take with food to avoid upset stomach. Finish all the pills  Soak finger in warm water several times daily Apply bactroban  ointment twice daily after soaking     ED Prescriptions     Medication Sig Dispense Auth. Provider   amoxicillin -clavulanate (AUGMENTIN ) 875-125 MG tablet Take 1 tablet by mouth every 12 (twelve) hours for 7 days. 14 tablet Siera Beyersdorf, PA-C   mupirocin  ointment (BACTROBAN ) 2 % Apply 1 Application topically 2 (two) times daily. 15 g Asuncion Shibata, Ivette Marks, PA-C      PDMP not reviewed this encounter.   Vann Okerlund, Ivette Marks, [place] 03/21/24 1504

## 2024-03-21 NOTE — ED Triage Notes (Signed)
 Pt present with c/o rt third finger swelling since yesterday and pain X two days. Pt states she biters her finger and is concerned of an ingrown nail.

## 2024-03-29 ENCOUNTER — Other Ambulatory Visit: Payer: Self-pay | Admitting: Family Medicine

## 2024-04-12 ENCOUNTER — Ambulatory Visit: Admitting: Obstetrics and Gynecology

## 2024-05-03 ENCOUNTER — Encounter: Payer: Self-pay | Admitting: Obstetrics and Gynecology

## 2024-05-03 ENCOUNTER — Ambulatory Visit: Payer: Self-pay | Admitting: Obstetrics and Gynecology

## 2024-05-03 ENCOUNTER — Other Ambulatory Visit (HOSPITAL_COMMUNITY)
Admission: RE | Admit: 2024-05-03 | Discharge: 2024-05-03 | Disposition: A | Discharge status: Home / Self Care | Source: Ambulatory Visit | Attending: Obstetrics and Gynecology | Admitting: Obstetrics and Gynecology

## 2024-05-03 ENCOUNTER — Ambulatory Visit: Admitting: Obstetrics and Gynecology

## 2024-05-03 DIAGNOSIS — R87612 Low grade squamous intraepithelial lesion on cytologic smear of cervix (LGSIL): Secondary | ICD-10-CM | POA: Diagnosis not present

## 2024-05-03 DIAGNOSIS — R3989 Other symptoms and signs involving the genitourinary system: Secondary | ICD-10-CM

## 2024-05-03 DIAGNOSIS — N898 Other specified noninflammatory disorders of vagina: Secondary | ICD-10-CM

## 2024-05-03 DIAGNOSIS — Z3202 Encounter for pregnancy test, result negative: Secondary | ICD-10-CM

## 2024-05-03 DIAGNOSIS — O10919 Unspecified pre-existing hypertension complicating pregnancy, unspecified trimester: Secondary | ICD-10-CM

## 2024-05-03 LAB — POCT URINALYSIS DIPSTICK
Bilirubin, UA: NEGATIVE
Glucose, UA: NEGATIVE
Ketones, UA: NEGATIVE
Nitrite, UA: POSITIVE
Protein, UA: POSITIVE — AB
Spec Grav, UA: 1.02 (ref 1.010–1.025)
Urobilinogen, UA: 0.2 U/dL
pH, UA: 6.5 (ref 5.0–8.0)

## 2024-05-03 LAB — POCT URINE PREGNANCY: Preg Test, Ur: NEGATIVE

## 2024-05-03 MED ORDER — SLYND 4 MG PO TABS: 1.0000 | ORAL_TABLET | Freq: Every day | ORAL | 3 refills | Status: AC

## 2024-05-03 MED ORDER — NITROFURANTOIN MONOHYD MACRO 100 MG PO CAPS: 100.0000 mg | ORAL_CAPSULE | Freq: Two times a day (BID) | ORAL | 0 refills | Status: AC

## 2024-05-03 MED ORDER — LABETALOL HCL 400 MG PO TABS: 400.0000 mg | ORAL_TABLET | Freq: Two times a day (BID) | ORAL | 11 refills | Status: AC

## 2024-05-03 NOTE — Progress Notes (Signed)
 1.  Mood and well being: Patient with negative depression screening today. Reviewed local resources for support.  - Patient tobacco use? Yes, 0.5 ppd 11 Years. Patient desires to quit? No.   - hx of drug use? No.    Drinks socially    2. Infant care and feeding:  -Patient currently breastmilk feeding? No.  -Social determinants of health (SDOH) reviewed in EPIC. No concerns   3. Sexuality, contraception and birth spacing - Patient does not want a pregnancy in the next year.  Desired family size is 4 children.  - Reviewed reproductive life planning. Reviewed contraceptive methods based on pt preferences and effectiveness.  Patient desired Female Condom and Oral Contraceptive today.   - Discussed birth spacing of 18 months   4. Sleep and fatigue -Encouraged family/partner/community support of 4 hrs of uninterrupted sleep to help with mood and fatigue   5. Physical Recovery  - Discussed patients delivery and complications. She describes her labor as good. - Patient had a Vaginal, no problems at delivery. Patient had a 2nd degree labial laceration. Not bothering her Perineal healing reviewed.   Patient expressed understanding - Patient has urinary incontinence? No. - Patient is safe to resume physical and sexual activity   6.  HM - Last pap smear  -normal   Pap smear not done at today's visit.  -Breast Cancer screening indicated? No.    7. Chronic Disease/Pregnancy Condition follow up: Hypertension Contineus to take labetalol  200 TID    - PCP follow up

## 2024-05-03 NOTE — Patient Instructions (Signed)

## 2024-05-03 NOTE — Progress Notes (Signed)
 Post Partum Visit Note  Anita Herrera is a 28 y.o. [phone] female who presents for a postpartum visit. She is 4 weeks postpartum following a normal spontaneous vaginal delivery.  I have fully reviewed the prenatal and intrapartum course. The delivery was at 38.6 gestational weeks.  Anesthesia: epidural. Postpartum course has been doing well. Baby is doing well. Baby is feeding by bottle - Similac Sensitive RS. Bleeding no bleeding. Bowel function is normal. Bladder function is normal. Patient is sexually active. Contraception method is condoms.  Pt is interested in OCP.  Postpartum depression screening: negative.  The pregnancy intention screening data noted above was reviewed. Potential methods of contraception were discussed. The patient elected to proceed with Oral Contraceptive.   Edinburgh Postnatal Depression Scale - 05/03/24 1517       Edinburgh Postnatal Depression Scale:  In the Past 7 Days   I have been able to laugh and see the funny side of things. 0    I have looked forward with enjoyment to things. 0    I have blamed myself unnecessarily when things went wrong. 0    I have been anxious or worried for no good reason. 0    I have felt scared or panicky for no good reason. 0    Things have been getting on top of me. 1    I have been so unhappy that I have had difficulty sleeping. 0    I have felt sad or miserable. 0    I have been so unhappy that I have been crying. 0    The thought of harming myself has occurred to me. 0    Edinburgh Postnatal Depression Scale Total 1          Health Maintenance Due  Topic Date Due   Pneumococcal Vaccine 70-75 Years old (1 of 2 - PCV) Never done   Hepatitis B Vaccines (1 of 3 - 19+ 3-dose series) Never done   HPV VACCINES (1 - 3-dose SCDM series) Never done   COVID-19 Vaccine (1 - 2024-25 season) Never done   DTaP/Tdap/Td (2 - Td or Tdap) 04/20/2024   The following portions of the patient's history were reviewed and updated  as appropriate: allergies, current medications, past family history, past medical history, past social history, past surgical history, and problem list.  Review of Systems Pertinent items are noted in HPI.  Objective:  BP 139/86   Pulse (!) 59   Wt 275 lb (124.7 kg)   LMP 06/06/2023   Breastfeeding No   BMI 44.39 kg/m    General:  alert, cooperative, and no distress   Breasts:  not indicated  Lungs: Normal effort  Heart:  Mild bradycardia noted, asymptomatic  Abdomen: Soft, non tender   Wound N/a  GU exam:  N/a       Assessment:   Postpartum exam -     Drospirenone  (SLYND ) 4 MG TABS; Take 1 tablet (4 mg total) by mouth daily.  Chronic hypertension affecting pregnancy St I HTN on exam today, will increase labetalol  dose PCP list given to take over BP management -     Labetalol  HCl 400 MG TABS; Take 400 mg by mouth 2 (two) times daily.  Vaginal discharge -     Cervicovaginal ancillary only( [place]) -     Urine Culture  Urinary problem -     Cervicovaginal ancillary only( [place]) -     Urine Culture -     nitrofurantoin ,  macrocrystal-monohydrate, (MACROBID ) 100 MG capsule; Take 1 capsule (100 mg total) by mouth 2 (two) times daily for 5 days.  LGSIL of cervix of undetermined significance Pap w/ HPV in 1 year  Plan:   Essential components of care per ACOG recommendations:  1.  Mood and well being: Patient with negative depression screening today. Reviewed local resources for support.  - Patient tobacco use? Yes. Patient desires to quit? No.   - hx of drug use? No.    2. Infant care and feeding:  -Patient currently breastmilk feeding? No.  -Social determinants of health (SDOH) reviewed in EPIC. No concerns  3. Sexuality, contraception and birth spacing - Patient does not want a pregnancy in the next year.  Desired family size is 4 children.  - Reviewed reproductive life planning. Reviewed contraceptive methods based on pt preferences and effectiveness.   Patient desired Oral Contraceptive today.   - Discussed birth spacing of 18 months  4. Sleep and fatigue -Encouraged family/partner/community support of 4 hrs of uninterrupted sleep to help with mood and fatigue  5. Physical Recovery  - Discussed patients delivery and complications. She describes her labor as good. - Patient had a Vaginal, no problems at delivery. Patient had a labial laceration. Perineal healing reviewed. Patient expressed understanding - Patient has urinary incontinence? No. - Patient is safe to resume physical and sexual activity  6.  Health Maintenance - HM due items addressed Yes - Last pap smear  Diagnosis  Date Value Ref Range Status  10/23/2023   Final   - Negative for intraepithelial lesion or malignancy (NILM)  -Breast Cancer screening indicated? No.   7. Chronic Disease/Pregnancy Condition follow up:  - Chronic hypertension: increase to labetalol  400 BID, PCP list given  Kieth JAYSON Carolin, MD [facility], [facility]

## 2024-05-04 LAB — CERVICOVAGINAL ANCILLARY ONLY
Bacterial Vaginitis (gardnerella): POSITIVE — AB
Candida Glabrata: NEGATIVE
Candida Vaginitis: NEGATIVE
Chlamydia: NEGATIVE
Comment: NEGATIVE
Comment: NEGATIVE
Comment: NEGATIVE
Comment: NEGATIVE
Comment: NEGATIVE
Comment: NORMAL
Neisseria Gonorrhea: NEGATIVE
Trichomonas: NEGATIVE

## 2024-05-04 MED ORDER — METRONIDAZOLE 0.75 % VA GEL: 1.0000 | Freq: Every day | VAGINAL | 0 refills | Status: AC

## 2024-05-07 LAB — URINE CULTURE

## 2024-05-07 MED ORDER — AMOXICILLIN-POT CLAVULANATE 875-125 MG PO TABS: 1.0000 | ORAL_TABLET | Freq: Two times a day (BID) | ORAL | 0 refills | Status: AC

## 2024-05-14 ENCOUNTER — Ambulatory Visit: Admitting: Pharmacist

## 2024-05-28 ENCOUNTER — Ambulatory Visit: Admitting: Pharmacist

## 2024-06-11 ENCOUNTER — Ambulatory Visit: Admitting: Pharmacist

## 2024-07-02 ENCOUNTER — Ambulatory Visit: Admitting: Pharmacist

## 2024-07-26 ENCOUNTER — Ambulatory Visit
Admission: EM | Admit: 2024-07-26 | Discharge: 2024-07-26 | Disposition: A | Discharge status: Home / Self Care | Attending: Family Medicine | Admitting: Family Medicine

## 2024-07-26 DIAGNOSIS — S61215A Laceration without foreign body of left ring finger without damage to nail, initial encounter: Secondary | ICD-10-CM

## 2024-07-26 DIAGNOSIS — M79645 Pain in left finger(s): Secondary | ICD-10-CM

## 2024-07-26 DIAGNOSIS — Z23 Encounter for immunization: Secondary | ICD-10-CM | POA: Diagnosis not present

## 2024-07-26 MED ORDER — TETANUS-DIPHTH-ACELL PERTUSSIS 5-2-15.5 LF-MCG/0.5 IM SUSP
0.5000 mL | Freq: Once | INTRAMUSCULAR | Status: AC
  Administered 2024-07-26: 0.5 mL via INTRAMUSCULAR

## 2024-07-26 NOTE — ED Provider Notes (Signed)
 [facility]  Note:  This document was prepared using Conservation officer, historic buildings and may include unintentional dictation errors.  MRN: [id number] DOB: 1996/06/27  Subjective:   Anita Herrera is a 28 y.o. female presenting for left ring finger pain.  Suffered a distal finger pad laceration from a kitchen knife at home.  Wound was sustained about 20 hours ago.  Needs her Tdap updated.  No current facility-administered medications for this encounter.  Current Outpatient Medications:    Drospirenone  (SLYND ) 4 MG TABS, Take 1 tablet (4 mg total) by mouth daily., Disp: 90 tablet, Rfl: 3   ibuprofen  (ADVIL ) 600 MG tablet, Take 1 tablet (600 mg total) by mouth every 6 (six) hours., Disp: 40 tablet, Rfl: 0   Labetalol  HCl 400 MG TABS, Take 400 mg by mouth 2 (two) times daily., Disp: 60 tablet, Rfl: 11   metroNIDAZOLE  (METROGEL ) 0.75 % vaginal gel, Place 1 Applicatorful vaginally at bedtime. Apply one applicatorful to vagina at bedtime for 5 days, Disp: 70 g, Rfl: 0   mupirocin  ointment (BACTROBAN ) 2 %, Apply 1 Application topically 2 (two) times daily., Disp: 15 g, Rfl: 0   pantoprazole  (PROTONIX ) 20 MG tablet, Take 1 tablet (20 mg total) by mouth daily., Disp: 30 tablet, Rfl: 1   Prenatal 28-0.8 MG TABS, Take 1 tablet by mouth daily., Disp: 30 tablet, Rfl: 11   senna-docusate (SENOKOT-S) 8.6-50 MG tablet, Take 2 tablets by mouth daily., Disp: 60 tablet, Rfl: 0   No Known Allergies  Past Medical History:  Diagnosis Date   Anemia    Hypertension    Shingles    Vaginitis      Past Surgical History:  Procedure Laterality Date   WISDOM TOOTH EXTRACTION      Family History  Problem Relation Age of Onset   Diabetes Mother    Hypertension Maternal Grandmother    Asthma Neg Hx    Cancer Neg Hx    Birth defects Neg Hx    Heart disease Neg Hx    Stroke Neg Hx     Social History   Tobacco Use   Smoking status: Former    Current packs/day: 0.00     Types: Cigarettes    Quit date: 07/2023    Years since quitting: 1.0   Smokeless tobacco: Never  Vaping Use   Vaping status: Never Used  Substance Use Topics   Alcohol use: Yes    Comment: occ   Drug use: No    ROS   Objective:   Vitals: BP 131/84 (BP Location: Left Arm)   Pulse 66   Temp 98.1 F (36.7 C) (Oral)   Resp 20   SpO2 97%   Physical Exam Constitutional:      General: She is not in acute distress.    Appearance: Normal appearance. She is well-developed. She is not ill-appearing, toxic-appearing or diaphoretic.  HENT:     Head: Normocephalic and atraumatic.     Nose: Nose normal.     Mouth/Throat:     Mouth: Mucous membranes are moist.  Eyes:     General: No scleral icterus.       Right eye: No discharge.        Left eye: No discharge.     Extraocular Movements: Extraocular movements intact.  Cardiovascular:     Rate and Rhythm: Normal rate.  Pulmonary:     Effort: Pulmonary effort is normal.  Musculoskeletal:       Hands:  Skin:    General: Skin is warm and dry.  Neurological:     General: No focal deficit present.     Mental Status: She is alert and oriented to person, place, and time.  Psychiatric:        Mood and Affect: Mood normal.        Behavior: Behavior normal.    Dressing applied.  Tdap updated in clinic.  Assessment and Plan :   PDMP not reviewed this encounter.  1. Finger pain, left   2. Laceration of left ring finger without foreign body without damage to nail, initial encounter   3. Need for diphtheria-tetanus-pertussis (Tdap) vaccine    Given nature of the wound and high risk for infection, wound must heal by secondary intention.  Recommended dressing changes multiple times daily.  Counseled patient on potential for adverse effects with medications prescribed/recommended today, ER and return-to-clinic precautions discussed, patient verbalized understanding.    Christopher Savannah, [place] 07/26/24 [phone]

## 2024-07-26 NOTE — Discharge Instructions (Addendum)
 Change your dressing 3-5 times daily. Wash the wound with soap and water through each dressing change. Apply ointment, secure with non-stick gauze, cover with Coban.

## 2024-07-26 NOTE — ED Triage Notes (Addendum)
 Pt states she cut her left ring finger on kitchen knife cutting potatoes ~8pm yesterday-NAD-steady gait

## 2024-09-10 ENCOUNTER — Ambulatory Visit
Admission: EM | Admit: 2024-09-10 | Discharge: 2024-09-10 | Disposition: A | Discharge status: Home / Self Care | Attending: Family Medicine | Admitting: Family Medicine

## 2024-09-10 DIAGNOSIS — J020 Streptococcal pharyngitis: Secondary | ICD-10-CM | POA: Diagnosis not present

## 2024-09-10 LAB — POCT RAPID STREP A (OFFICE): Rapid Strep A Screen: POSITIVE — AB

## 2024-09-10 MED ORDER — AMOXICILLIN 875 MG PO TABS: 875.0000 mg | ORAL_TABLET | Freq: Two times a day (BID) | ORAL | 0 refills | Status: AC

## 2024-09-10 NOTE — ED Provider Notes (Signed)
 [facility]  Note:  This document was prepared using Conservation officer, historic buildings and may include unintentional dictation errors.  MRN: [id number] DOB: [birth year]  Subjective:   Anita Herrera is a 28 y.o. female presenting for 3 day history of throat pain, painful swallowing, bilateral ear pain, sinus headaches, congestion. No fever, cough, chest pain, shob, wheezing. No asthma. Smokes a few cigarettes a day.   No current facility-administered medications for this encounter.  Current Outpatient Medications:    Drospirenone  (SLYND ) 4 MG TABS, Take 1 tablet (4 mg total) by mouth daily., Disp: 90 tablet, Rfl: 3   ibuprofen  (ADVIL ) 600 MG tablet, Take 1 tablet (600 mg total) by mouth every 6 (six) hours., Disp: 40 tablet, Rfl: 0   Labetalol  HCl 400 MG TABS, Take 400 mg by mouth 2 (two) times daily., Disp: 60 tablet, Rfl: 11   metroNIDAZOLE  (METROGEL ) 0.75 % vaginal gel, Place 1 Applicatorful vaginally at bedtime. Apply one applicatorful to vagina at bedtime for 5 days, Disp: 70 g, Rfl: 0   mupirocin  ointment (BACTROBAN ) 2 %, Apply 1 Application topically 2 (two) times daily., Disp: 15 g, Rfl: 0   pantoprazole  (PROTONIX ) 20 MG tablet, Take 1 tablet (20 mg total) by mouth daily., Disp: 30 tablet, Rfl: 1   Prenatal 28-0.8 MG TABS, Take 1 tablet by mouth daily., Disp: 30 tablet, Rfl: 11   senna-docusate (SENOKOT-S) 8.6-50 MG tablet, Take 2 tablets by mouth daily., Disp: 60 tablet, Rfl: 0   No Known Allergies  Past Medical History:  Diagnosis Date   Anemia    Hypertension    Shingles    Vaginitis      Past Surgical History:  Procedure Laterality Date   WISDOM TOOTH EXTRACTION      Family History  Problem Relation Age of Onset   Diabetes Mother    Hypertension Maternal Grandmother    Asthma Neg Hx    Cancer Neg Hx    Birth defects Neg Hx    Heart disease Neg Hx    Stroke Neg Hx     Social History   Tobacco Use   Smoking status: Former     Current packs/day: 0.00    Types: Cigarettes    Quit date: 07/2023    Years since quitting: 1.1   Smokeless tobacco: Never  Vaping Use   Vaping status: Never Used  Substance Use Topics   Alcohol use: Yes    Comment: occ   Drug use: No    ROS   Objective:   Vitals: BP (!) 154/101 (BP Location: Right Arm)   Pulse 78   Temp 99.5 F (37.5 C) (Oral)   SpO2 98%   Physical Exam Constitutional:      General: She is not in acute distress.    Appearance: Normal appearance. She is well-developed and normal weight. She is not ill-appearing, toxic-appearing or diaphoretic.  HENT:     Head: Normocephalic and atraumatic.     Right Ear: Tympanic membrane, ear canal and external ear normal. No drainage or tenderness. No middle ear effusion. There is no impacted cerumen. Tympanic membrane is not erythematous or bulging.     Left Ear: Tympanic membrane, ear canal and external ear normal. No drainage or tenderness.  No middle ear effusion. There is no impacted cerumen. Tympanic membrane is not erythematous or bulging.     Nose: Nose normal. No congestion or rhinorrhea.     Mouth/Throat:     Mouth: Mucous membranes  are moist. No oral lesions.     Pharynx: Posterior oropharyngeal erythema present. No pharyngeal swelling, oropharyngeal exudate or uvula swelling.     Tonsils: No tonsillar exudate or tonsillar abscesses. 0 on the right. 0 on the left.  Eyes:     General: No scleral icterus.       Right eye: No discharge.        Left eye: No discharge.     Extraocular Movements: Extraocular movements intact.     Right eye: Normal extraocular motion.     Left eye: Normal extraocular motion.     Conjunctiva/sclera: Conjunctivae normal.  Cardiovascular:     Rate and Rhythm: Normal rate.  Pulmonary:     Effort: Pulmonary effort is normal.  Musculoskeletal:     Cervical back: Normal range of motion and neck supple.  Lymphadenopathy:     Cervical: No cervical adenopathy.  Skin:    General:  Skin is warm and dry.  Neurological:     General: No focal deficit present.     Mental Status: She is alert and oriented to person, place, and time.  Psychiatric:        Mood and Affect: Mood normal.        Behavior: Behavior normal.    Results for orders placed or performed during the hospital encounter of 09/10/24 (from the past 24 hours)  POCT rapid strep A     Status: Abnormal   Collection Time: 09/10/24  6:07 PM  Result Value Ref Range   Rapid Strep A Screen Positive (A) Negative    Assessment and Plan :   PDMP not reviewed this encounter.  1. Strep pharyngitis    Will treat for strep pharyngitis.  Patient is to start amoxicillin , use supportive care otherwise. Counseled patient on potential for adverse effects with medications prescribed/recommended today, ER and return-to-clinic precautions discussed, patient verbalized understanding.     Christopher Savannah, [place] 09/10/24 8161

## 2024-09-10 NOTE — ED Triage Notes (Signed)
 Pt reports bilateral ear pain, headache and sore throat x 3 days. Dayquil, Nyquil and Theraflu gives no relief.
